# Patient Record
Sex: Male | Born: 1980 | Race: White | Hispanic: No | Marital: Married | Smoking: Never smoker
Health system: Southern US, Community
[De-identification: ages and names within clinical notes are randomized; demographics above are authoritative.]

## PROBLEM LIST (undated history)

## (undated) HISTORY — PX: FRACTURE SURGERY: SHX138

## (undated) HISTORY — PX: FACIAL RECONSTRUCTION SURGERY: SHX631

---
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
## 2001-07-03 ENCOUNTER — Encounter: Payer: Self-pay | Admitting: Emergency Medicine

## 2001-07-03 ENCOUNTER — Ambulatory Visit (HOSPITAL_COMMUNITY): Admission: EM | Admit: 2001-07-03 | Discharge: 2001-07-03 | Payer: Self-pay | Admitting: Emergency Medicine

## 2005-01-13 ENCOUNTER — Ambulatory Visit (HOSPITAL_COMMUNITY): Admission: RE | Admit: 2005-01-13 | Discharge: 2005-01-13 | Payer: Self-pay | Admitting: Otolaryngology

## 2005-01-13 ENCOUNTER — Ambulatory Visit (HOSPITAL_BASED_OUTPATIENT_CLINIC_OR_DEPARTMENT_OTHER): Admission: RE | Admit: 2005-01-13 | Discharge: 2005-01-13 | Payer: Self-pay | Admitting: Otolaryngology

## 2007-06-16 HISTORY — PX: NASAL SINUS SURGERY: SHX719

## 2013-03-23 ENCOUNTER — Ambulatory Visit: Payer: Self-pay | Admitting: Emergency Medicine

## 2013-03-23 VITALS — BP 136/96 | HR 93 | Temp 98.2°F | Resp 18 | Ht 66.5 in | Wt 222.4 lb

## 2013-03-23 DIAGNOSIS — J018 Other acute sinusitis: Secondary | ICD-10-CM

## 2013-03-23 MED ORDER — PSEUDOEPHEDRINE-GUAIFENESIN ER 60-600 MG PO TB12: 1.0000 | ORAL_TABLET | Freq: Two times a day (BID) | ORAL | Status: AC

## 2013-03-23 MED ORDER — AMOXICILLIN-POT CLAVULANATE 875-125 MG PO TABS: 1.0000 | ORAL_TABLET | Freq: Two times a day (BID) | ORAL | Status: DC

## 2013-03-23 NOTE — Progress Notes (Signed)
[facility] [address], [place] [address] [phone]  Date:  03/23/2013   Name:  [name]   DOB:  1980/06/24   MRN:  [id number]  PCP:  No primary provider on file.    Chief Complaint: Cough, Generalized Body Aches and Nasal Congestion   History of Present Illness:  Brandon Wiggins is a 32 y.o. very pleasant male patient who presents with the following:  Ill two days with nasal congestion and drainage that is purulent in nature. Has cough and sore throat.  Non productive cough.  No fever or chills.  No nausea or vomiting.  No wheezing or shortness of breath.  No rash.  Moderate post nasal drainage.  No improvement with over the counter medications or other home remedies. Denies other complaint or health concern today.   There are no active problems to display for this patient.   History reviewed. No pertinent past medical history.  Past Surgical History  Procedure Laterality Date  . Fracture surgery    . Nasal sinus surgery  2009    History  Substance Use Topics  . Smoking status: Never Smoker   . Smokeless tobacco: Not on file  . Alcohol Use: No    No family history on file.  No Known Allergies  Medication list has been reviewed and updated.  No current outpatient prescriptions on file prior to visit.   No current facility-administered medications on file prior to visit.    Review of Systems:  As per HPI, otherwise negative.    Physical Examination: Filed Vitals:   03/23/13 1723  BP: 136/96  Pulse: 93  Temp: 98.2 F (36.8 C)  Resp: 18   Filed Vitals:   03/23/13 1723  Height: 5' 6.5" (1.689 m)  Weight: 222 lb 6.4 oz (100.88 kg)   Body mass index is 35.36 kg/(m^2). Ideal Body Weight: Weight in (lb) to have BMI = 25: 156.9  GEN: WDWN, NAD, Non-toxic, A & O x 3 HEENT: Atraumatic, Normocephalic. Neck supple. No masses, No LAD. Ears and Nose: No external deformity.  Green nasal drainage CV: RRR, No M/G/R. No JVD.  No thrill. No extra heart sounds. PULM: CTA B, no wheezes, crackles, rhonchi. No retractions. No resp. distress. No accessory muscle use. ABD: S, NT, ND, +BS. No rebound. No HSM. EXTR: No c/c/e NEURO Normal gait.  PSYCH: Normally interactive. Conversant. Not depressed or anxious appearing.  Calm demeanor.    Assessment and Plan: Sinusitis augmentin mucinex   Signed,  Phillips Odor, MD

## 2013-03-23 NOTE — Patient Instructions (Signed)

## 2014-03-12 ENCOUNTER — Ambulatory Visit: Payer: No Typology Code available for payment source

## 2014-03-12 ENCOUNTER — Encounter: Payer: Self-pay | Admitting: Internal Medicine

## 2014-03-12 ENCOUNTER — Ambulatory Visit: Payer: No Typology Code available for payment source | Admitting: Physician Assistant

## 2014-03-28 ENCOUNTER — Ambulatory Visit: Payer: Self-pay | Admitting: Physician Assistant

## 2014-09-27 ENCOUNTER — Emergency Department (HOSPITAL_BASED_OUTPATIENT_CLINIC_OR_DEPARTMENT_OTHER)
Admission: EM | Admit: 2014-09-27 | Discharge: 2014-09-27 | Disposition: A | Discharge status: Home / Self Care | Payer: No Typology Code available for payment source | Attending: Emergency Medicine | Admitting: Emergency Medicine

## 2014-09-27 ENCOUNTER — Emergency Department (HOSPITAL_BASED_OUTPATIENT_CLINIC_OR_DEPARTMENT_OTHER): Payer: No Typology Code available for payment source

## 2014-09-27 ENCOUNTER — Encounter (HOSPITAL_BASED_OUTPATIENT_CLINIC_OR_DEPARTMENT_OTHER): Payer: Self-pay | Admitting: *Deleted

## 2014-09-27 DIAGNOSIS — R0602 Shortness of breath: Secondary | ICD-10-CM | POA: Insufficient documentation

## 2014-09-27 DIAGNOSIS — Z792 Long term (current) use of antibiotics: Secondary | ICD-10-CM | POA: Insufficient documentation

## 2014-09-27 DIAGNOSIS — R079 Chest pain, unspecified: Secondary | ICD-10-CM

## 2014-09-27 DIAGNOSIS — R0789 Other chest pain: Secondary | ICD-10-CM | POA: Insufficient documentation

## 2014-09-27 DIAGNOSIS — Z8781 Personal history of (healed) traumatic fracture: Secondary | ICD-10-CM | POA: Diagnosis not present

## 2014-09-27 LAB — CBC WITH DIFFERENTIAL/PLATELET
Basophils Absolute: 0 10*3/uL (ref 0.0–0.1)
Basophils Relative: 0 % (ref 0–1)
EOS ABS: 0.3 10*3/uL (ref 0.0–0.7)
EOS PCT: 4 % (ref 0–5)
HCT: 42 % (ref 39.0–52.0)
Hemoglobin: 14.5 g/dL (ref 13.0–17.0)
LYMPHS ABS: 2.2 10*3/uL (ref 0.7–4.0)
Lymphocytes Relative: 25 % (ref 12–46)
MCH: 29.1 pg (ref 26.0–34.0)
MCHC: 34.5 g/dL (ref 30.0–36.0)
MCV: 84.2 fL (ref 78.0–100.0)
Monocytes Absolute: 0.9 10*3/uL (ref 0.1–1.0)
Monocytes Relative: 10 % (ref 3–12)
Neutro Abs: 5.2 10*3/uL (ref 1.7–7.7)
Neutrophils Relative %: 61 % (ref 43–77)
Platelets: 273 10*3/uL (ref 150–400)
RBC: 4.99 MIL/uL (ref 4.22–5.81)
RDW: 12.7 % (ref 11.5–15.5)
WBC: 8.5 10*3/uL (ref 4.0–10.5)

## 2014-09-27 LAB — BASIC METABOLIC PANEL
Anion gap: 11 (ref 5–15)
BUN: 17 mg/dL (ref 6–23)
CHLORIDE: 102 mmol/L (ref 96–112)
CO2: 26 mmol/L (ref 19–32)
CREATININE: 1.34 mg/dL (ref 0.50–1.35)
Calcium: 9.2 mg/dL (ref 8.4–10.5)
GFR calc non Af Amer: 68 mL/min — ABNORMAL LOW (ref >90)
GFR, EST AFRICAN AMERICAN: 79 mL/min — AB (ref >90)
Glucose, Bld: 110 mg/dL — ABNORMAL HIGH (ref 70–99)
POTASSIUM: 3.8 mmol/L (ref 3.5–5.1)
SODIUM: 139 mmol/L (ref 135–145)

## 2014-09-27 LAB — TROPONIN I

## 2014-09-27 NOTE — Discharge Instructions (Signed)

## 2014-09-27 NOTE — ED Notes (Signed)
MD at bedside. 

## 2014-09-27 NOTE — ED Provider Notes (Signed)
CSN: [id number]     Arrival date & time 09/27/14  1921 History   This chart was scribed for Brandon LovelessScott Celestine Prim, MD by Modena JanskyAlbert Thayil, ED Scribe. This patient was seen in room MH04/MH04 and the patient's care was started at 7:59 PM.   Chief Complaint  Patient presents with  . Chest Pain   The history is provided by the patient. No language interpreter was used.   HPI Comments: Brandon Wiggins is a 34 y.o. male who presents to the Emergency Department complaining of constant chest pain just left of sternum that has been going on for "months" (unable to clarify exact onset). He reports that he went to the Urgent Care recently to be seen, then came here to the ED because he was waiting too long. He states that he is concerned about MI. He reports that his pain has been waxing and waning with no modifying factors. He describes the pain as a tightness sensation with intermittent SOB. He reports no treatment PTA. He denies any abdominal pain or leg swelling. No exertional symptoms. No pleuritic symptoms.  No past medical history on file. Past Surgical History  Procedure Laterality Date  . Fracture surgery    . Nasal sinus surgery  2009  . Facial reconstruction surgery     No family history on file. History  Substance Use Topics  . Smoking status: Never Smoker   . Smokeless tobacco: Never Used  . Alcohol Use: No    Review of Systems  Constitutional: Negative for fever.  Respiratory: Positive for shortness of breath. Negative for cough.   Cardiovascular: Positive for chest pain. Negative for leg swelling.  Gastrointestinal: Negative for vomiting and abdominal pain.  All other systems reviewed and are negative.   Allergies  Review of patient's allergies indicates no known allergies.  Home Medications   Prior to Admission medications   Medication Sig Start Date End Date Taking? Authorizing Provider  amoxicillin-clavulanate (AUGMENTIN) 875-125 MG per tablet Take 1 tablet by mouth 2 (two)  times daily. 03/23/13   Carmelina DaneJeffery S Anderson, MD   BP 148/108 mmHg  Pulse 64  Temp(Src) 98.6 F (37 C) (Oral)  Resp 20  Ht 5\' 6"  (1.676 m)  Wt 225 lb (102.059 kg)  BMI 36.33 kg/m2  SpO2 100% Physical Exam  Constitutional: He is oriented to person, place, and time. He appears well-developed and well-nourished. No distress.  HENT:  Head: Normocephalic and atraumatic.  Right Ear: External ear normal.  Left Ear: External ear normal.  Nose: Nose normal.  Eyes: Right eye exhibits no discharge. Left eye exhibits no discharge.  Neck: Neck supple.  Cardiovascular: Normal rate, regular rhythm and normal heart sounds.   No murmur heard. Pulmonary/Chest: Effort normal and breath sounds normal. No respiratory distress. He exhibits no tenderness.  Abdominal: Soft. He exhibits no distension. There is no tenderness.  Musculoskeletal: He exhibits no edema.  Neurological: He is alert and oriented to person, place, and time.  Skin: Skin is warm and dry.  Psychiatric: He has a normal mood and affect.  Nursing note and vitals reviewed.   ED Course  Procedures (including critical care time) DIAGNOSTIC STUDIES: Oxygen Saturation is 100% on RA, normal by my interpretation.    COORDINATION OF CARE: 8:03 PM- Pt advised of plan for treatment which includes radiology and labs and pt agrees.  Labs Review Labs Reviewed  BASIC METABOLIC PANEL - Abnormal; Notable for the following:    Glucose, Bld 110 (*)    GFR  calc non Af Amer 68 (*)    GFR calc Af Amer 79 (*)    All other components within normal limits  CBC WITH DIFFERENTIAL/PLATELET  TROPONIN I    Imaging Review Dg Chest 2 View  09/27/2014   CLINICAL DATA:  Chest pain for 2 months.  EXAM: CHEST  2 VIEW  COMPARISON:  None.  FINDINGS: The heart size and mediastinal contours are within normal limits. Both lungs are clear. No pneumothorax or pleural effusion is noted. The visualized skeletal structures are unremarkable.  IMPRESSION: No active  cardiopulmonary disease.   Electronically Signed   By: Lupita Raider, M.D.   On: 09/27/2014 21:04     EKG Interpretation   Date/Time:  Thursday September 27 2014 19:28:49 EDT Ventricular Rate:  66 PR Interval:  136 QRS Duration: 92 QT Interval:  368 QTC Calculation: 385 R Axis:   41 Text Interpretation:  Normal sinus rhythm Normal ECG No old tracing to  compare Confirmed by Brandon Scarbrough  MD, Cierah Crader ([id number]) on 09/27/2014 7:48:07 PM      MDM   Final diagnoses:  Chest pain, unspecified chest pain type    No obvious etiology for patient's now chronic chest pain. ECG unremarkable, as well as a troponin. Do not feel he needs a delta given chronicity. CXR unremarkable. Physical exam benign. Low risk for PE and is PERC negative. Will recommend using ibuprofen (states he doesn't take medicine because he can "take the pain") and PCP f/u.  I personally performed the services described in this documentation, which was scribed in my presence. The recorded information has been reviewed and is accurate.     Brandon Loveless, MD 09/28/14 [phone]

## 2014-09-27 NOTE — ED Notes (Signed)
Pt reports chest pain "for months"- unable to see PCP- states went to Urgent Care once to be eval but wait was too long- states pain is always there, sometimes more severe than others

## 2018-09-15 ENCOUNTER — Observation Stay (HOSPITAL_COMMUNITY)
Admission: EM | Admit: 2018-09-15 | Discharge: 2018-09-16 | Disposition: A | Discharge status: Home / Self Care | Payer: Self-pay | Attending: Surgery | Admitting: Surgery

## 2018-09-15 ENCOUNTER — Emergency Department (HOSPITAL_COMMUNITY): Payer: Self-pay

## 2018-09-15 ENCOUNTER — Other Ambulatory Visit: Payer: Self-pay

## 2018-09-15 ENCOUNTER — Ambulatory Visit
Admission: EM | Admit: 2018-09-15 | Discharge: 2018-09-15 | Disposition: A | Discharge status: Home / Self Care | Payer: No Typology Code available for payment source

## 2018-09-15 DIAGNOSIS — K3580 Unspecified acute appendicitis: Principal | ICD-10-CM | POA: Diagnosis present

## 2018-09-15 DIAGNOSIS — R1031 Right lower quadrant pain: Secondary | ICD-10-CM

## 2018-09-15 DIAGNOSIS — K429 Umbilical hernia without obstruction or gangrene: Secondary | ICD-10-CM | POA: Insufficient documentation

## 2018-09-15 DIAGNOSIS — K3589 Other acute appendicitis without perforation or gangrene: Secondary | ICD-10-CM

## 2018-09-15 LAB — COMPREHENSIVE METABOLIC PANEL
ALT: 33 U/L (ref 0–44)
AST: 25 U/L (ref 15–41)
Albumin: 4.2 g/dL (ref 3.5–5.0)
Alkaline Phosphatase: 54 U/L (ref 38–126)
Anion gap: 10 (ref 5–15)
BUN: 16 mg/dL (ref 6–20)
CO2: 25 mmol/L (ref 22–32)
Calcium: 9.2 mg/dL (ref 8.9–10.3)
Chloride: 102 mmol/L (ref 98–111)
Creatinine, Ser: 1.17 mg/dL (ref 0.61–1.24)
GFR calc Af Amer: >60 mL/min (ref >60)
GFR calc non Af Amer: >60 mL/min (ref >60)
Glucose, Bld: 104 mg/dL — ABNORMAL HIGH (ref 70–99)
Potassium: 3.7 mmol/L (ref 3.5–5.1)
Sodium: 137 mmol/L (ref 135–145)
Total Bilirubin: 0.8 mg/dL (ref 0.3–1.2)
Total Protein: 7.9 g/dL (ref 6.5–8.1)

## 2018-09-15 LAB — CBC WITH DIFFERENTIAL/PLATELET
Abs Immature Granulocytes: 0.04 10*3/uL (ref 0.00–0.07)
Basophils Absolute: 0.1 10*3/uL (ref 0.0–0.1)
Basophils Relative: 1 %
Eosinophils Absolute: 0.2 10*3/uL (ref 0.0–0.5)
Eosinophils Relative: 2 %
HCT: 41.4 % (ref 39.0–52.0)
Hemoglobin: 14.1 g/dL (ref 13.0–17.0)
Immature Granulocytes: 0 %
Lymphocytes Relative: 16 %
Lymphs Abs: 1.7 10*3/uL (ref 0.7–4.0)
MCH: 29.1 pg (ref 26.0–34.0)
MCHC: 34.1 g/dL (ref 30.0–36.0)
MCV: 85.5 fL (ref 80.0–100.0)
Monocytes Absolute: 1 10*3/uL (ref 0.1–1.0)
Monocytes Relative: 9 %
Neutro Abs: 7.4 10*3/uL (ref 1.7–7.7)
Neutrophils Relative %: 72 %
Platelets: 269 10*3/uL (ref 150–400)
RBC: 4.84 MIL/uL (ref 4.22–5.81)
RDW: 12.5 % (ref 11.5–15.5)
WBC: 10.4 10*3/uL (ref 4.0–10.5)
nRBC: 0 % (ref 0.0–0.2)

## 2018-09-15 LAB — URINALYSIS, ROUTINE W REFLEX MICROSCOPIC
Bilirubin Urine: NEGATIVE
Glucose, UA: NEGATIVE mg/dL
Hgb urine dipstick: NEGATIVE
Ketones, ur: NEGATIVE mg/dL
Leukocytes,Ua: NEGATIVE
Nitrite: NEGATIVE
Protein, ur: NEGATIVE mg/dL
Specific Gravity, Urine: 1.025 (ref 1.005–1.030)
pH: 5 (ref 5.0–8.0)

## 2018-09-15 LAB — LIPASE, BLOOD: Lipase: 25 U/L (ref 11–51)

## 2018-09-15 MED ORDER — ONDANSETRON 4 MG PO TBDP
4.0000 mg | ORAL_TABLET | Freq: Four times a day (QID) | ORAL | Status: DC | PRN
  Administered 2018-09-16: 4 mg via ORAL

## 2018-09-15 MED ORDER — DIPHENHYDRAMINE HCL 25 MG PO CAPS: 25.0000 mg | ORAL_CAPSULE | Freq: Four times a day (QID) | ORAL | Status: DC | PRN

## 2018-09-15 MED ORDER — IOHEXOL 300 MG/ML  SOLN
100.0000 mL | Freq: Once | INTRAMUSCULAR | Status: AC | PRN
  Administered 2018-09-15: 22:00:00 100 mL via INTRAVENOUS

## 2018-09-15 MED ORDER — MORPHINE SULFATE (PF) 2 MG/ML IV SOLN
2.0000 mg | INTRAVENOUS | Status: DC | PRN
  Administered 2018-09-16: 2 mg via INTRAVENOUS
  Filled 2018-09-15: qty 1

## 2018-09-15 MED ORDER — SODIUM CHLORIDE 0.9 % IV SOLN
2.0000 g | INTRAVENOUS | Status: DC
  Administered 2018-09-16: 2 g via INTRAVENOUS
  Filled 2018-09-15 (×2): qty 20

## 2018-09-15 MED ORDER — POTASSIUM CHLORIDE IN NACL 20-0.9 MEQ/L-% IV SOLN
INTRAVENOUS | Status: DC
  Administered 2018-09-16: via INTRAVENOUS
  Filled 2018-09-15: qty 1000

## 2018-09-15 MED ORDER — ONDANSETRON HCL 4 MG/2ML IJ SOLN
4.0000 mg | Freq: Four times a day (QID) | INTRAMUSCULAR | Status: DC | PRN
  Administered 2018-09-16: 4 mg via INTRAVENOUS
  Filled 2018-09-15 (×2): qty 2

## 2018-09-15 MED ORDER — METRONIDAZOLE IN NACL 5-0.79 MG/ML-% IV SOLN
500.0000 mg | Freq: Three times a day (TID) | INTRAVENOUS | Status: DC
  Administered 2018-09-16 (×2): 500 mg via INTRAVENOUS
  Filled 2018-09-15 (×2): qty 100

## 2018-09-15 MED ORDER — DIPHENHYDRAMINE HCL 50 MG/ML IJ SOLN: 25.0000 mg | Freq: Four times a day (QID) | INTRAMUSCULAR | Status: DC | PRN

## 2018-09-15 NOTE — ED Provider Notes (Signed)
2208: Pt handed off to me by previous EDPA at shift change pending CTAP to r/o appendicitis. Last food at 1230, drank water en route to ED.  Physical Exam  BP (!) 162/113 (BP Location: Left Arm)   Pulse (!) 102   Temp 98.6 F (37 C) (Oral)   Resp 18   SpO2 97%    ED Course/Procedures   Clinical Course as of Sep 15 2206  Thu Sep 15, 2018  2133 Called CT regarding delay apparently not aware if pt had IV. Will come get patient.    [CG]    Clinical Course User Index [CG] Liberty Handy, PA-C    Procedures  MDM   2209: CTAP confirms appendicitis w/o perforation or abscess. Labs unremarkable. VS WNL and stable.  No SIRS criteria. Pt not requiring analgesia or antiemetics. Spoke to Dr Gwinda Passe who will see patient.       Liberty Handy, PA-C 09/15/18 2210    Charlynne Pander, MD 09/15/18 [phone]

## 2018-09-15 NOTE — ED Notes (Signed)
Report given to 6N

## 2018-09-15 NOTE — H&P (Signed)
Brandon Wiggins is an 38 y.o. male.   Chief Complaint: RLQ abdominal pain HPI: This is a 37 year old male who presents with a 3 day history of slowly worsening abdominal pain, now localized to the RLQ.  No nausea, vomiting, or diarrhea.  He reports some subjective fever.  Decreased appetite.  Difficulty in BM for the last two days.  No previous abdominal surgery  No past medical history on file.  Past Surgical History:  Procedure Laterality Date  . FACIAL RECONSTRUCTION SURGERY    . FRACTURE SURGERY    . NASAL SINUS SURGERY  2009    No family history on file. Social History:  reports that he has never smoked. He has never used smokeless tobacco. He reports that he does not drink alcohol or use drugs.  Allergies: No Known Allergies  No meds   Results for orders placed or performed during the hospital encounter of 09/15/18 (from the past 48 hour(s))  Comprehensive metabolic panel     Status: Abnormal   Collection Time: 09/15/18  6:57 PM  Result Value Ref Range   Sodium 137 135 - 145 mmol/L   Potassium 3.7 3.5 - 5.1 mmol/L   Chloride 102 98 - 111 mmol/L   CO2 25 22 - 32 mmol/L   Glucose, Bld 104 (H) 70 - 99 mg/dL   BUN 16 6 - 20 mg/dL   Creatinine, Ser 8.08 0.61 - 1.24 mg/dL   Calcium 9.2 8.9 - 81.1 mg/dL   Total Protein 7.9 6.5 - 8.1 g/dL   Albumin 4.2 3.5 - 5.0 g/dL   AST 25 15 - 41 U/L   ALT 33 0 - 44 U/L   Alkaline Phosphatase 54 38 - 126 U/L   Total Bilirubin 0.8 0.3 - 1.2 mg/dL   GFR calc non Af Amer >60 >60 mL/min   GFR calc Af Amer >60 >60 mL/min   Anion gap 10 5 - 15    Comment: Performed at [facility], [address], [place], [place] [address]  CBC with Differential     Status: None   Collection Time: 09/15/18  6:57 PM  Result Value Ref Range   WBC 10.4 4.0 - 10.5 K/uL   RBC 4.84 4.22 - 5.81 MIL/uL   Hemoglobin 14.1 13.0 - 17.0 g/dL   HCT 45.8 59.2 - 92.4 %   MCV 85.5 80.0 - 100.0 fL   MCH 29.1 26.0 - 34.0 pg   MCHC 34.1 30.0 - 36.0 g/dL   RDW  46.2 86.3 - 81.7 %   Platelets 269 150 - 400 K/uL   nRBC 0.0 0.0 - 0.2 %   Neutrophils Relative % 72 %   Neutro Abs 7.4 1.7 - 7.7 K/uL   Lymphocytes Relative 16 %   Lymphs Abs 1.7 0.7 - 4.0 K/uL   Monocytes Relative 9 %   Monocytes Absolute 1.0 0.1 - 1.0 K/uL   Eosinophils Relative 2 %   Eosinophils Absolute 0.2 0.0 - 0.5 K/uL   Basophils Relative 1 %   Basophils Absolute 0.1 0.0 - 0.1 K/uL   Immature Granulocytes 0 %   Abs Immature Granulocytes 0.04 0.00 - 0.07 K/uL    Comment: Performed at [facility], [address], [place], [place] [address]  Lipase, blood     Status: None   Collection Time: 09/15/18  6:57 PM  Result Value Ref Range   Lipase 25 11 - 51 U/L    Comment: Performed at [facility], [address] [place], [place] [address]  Urinalysis, Routine w reflex microscopic     Status: None   Collection Time: 09/15/18  7:50 PM  Result Value Ref Range   Color, Urine YELLOW YELLOW   APPearance CLEAR CLEAR   Specific Gravity, Urine 1.025 1.005 - 1.030   pH 5.0 5.0 - 8.0   Glucose, UA NEGATIVE NEGATIVE mg/dL   Hgb urine dipstick NEGATIVE NEGATIVE   Bilirubin Urine NEGATIVE NEGATIVE   Ketones, ur NEGATIVE NEGATIVE mg/dL   Protein, ur NEGATIVE NEGATIVE mg/dL   Nitrite NEGATIVE NEGATIVE   Leukocytes,Ua NEGATIVE NEGATIVE    Comment: Performed at [facility], [address] [place], [place] [address]   Ct Abdomen Pelvis W Contrast  Result Date: 09/15/2018 CLINICAL DATA:  Right lower quadrant abdominal pain. EXAM: CT ABDOMEN AND PELVIS WITH CONTRAST TECHNIQUE: Multidetector CT imaging of the abdomen and pelvis was performed using the standard protocol following bolus administration of intravenous contrast. CONTRAST:  OMNIPAQUE IOHEXOL 300 MG/ML  SOLN COMPARISON:  None. FINDINGS: Lower chest: No acute abnormality. Hepatobiliary: No focal liver abnormality is seen. No gallstones, gallbladder wall thickening, or biliary dilatation. Pancreas: Unremarkable.  No pancreatic ductal dilatation or surrounding inflammatory changes. Spleen: Normal in size without focal abnormality. Adrenals/Urinary Tract: Adrenal glands are unremarkable. Kidneys are normal, without renal calculi, focal lesion, or hydronephrosis. Bladder is unremarkable. Stomach/Bowel: The stomach appears normal. There is no evidence of bowel obstruction or inflammation. The appendix is enlarged with surrounding inflammation consistent with acute appendicitis. Appendix: Location: Right lower quadrant. Diameter: 16 mm. Appendicolith: No. Mucosal hyper-enhancement: Yes. Extraluminal gas: No. Periappendiceal collection: No. Vascular/Lymphatic: No significant vascular findings are present. No enlarged abdominal or pelvic lymph nodes. Reproductive: Prostate is unremarkable. Other: Moderate size fat containing periumbilical hernia is noted. No ascites is noted. Musculoskeletal: No acute or significant osseous findings. IMPRESSION: Findings consistent with acute appendicitis. No definite evidence of abscess or perforation is noted. Electronically Signed   By: Lupita Raider, M.D.   On: 09/15/2018 22:02    Review of Systems  Constitutional: Negative for weight loss.  HENT: Negative for ear discharge, ear pain, hearing loss and tinnitus.   Eyes: Negative for blurred vision, double vision, photophobia and pain.  Respiratory: Negative for cough, sputum production and shortness of breath.   Cardiovascular: Negative for chest pain.  Gastrointestinal: Positive for abdominal pain. Negative for nausea and vomiting.  Genitourinary: Negative for dysuria, flank pain, frequency and urgency.  Musculoskeletal: Negative for back pain, falls, joint pain, myalgias and neck pain.  Neurological: Negative for dizziness, tingling, sensory change, focal weakness, loss of consciousness and headaches.  Endo/Heme/Allergies: Does not bruise/bleed easily.  Psychiatric/Behavioral: Negative for depression, memory loss and substance  abuse. The patient is not nervous/anxious.     Blood pressure (!) 162/113, pulse (!) 102, temperature 98.6 F (37 C), temperature source Oral, resp. rate 18, SpO2 97 %. Physical Exam  WDWN in NAD Eyes:  Pupils equal, round; sclera anicteric HENT:  Oral mucosa moist; good dentition  Neck:  No masses palpated, no thyromegaly Lungs:  CTA bilaterally; normal respiratory effort CV:  Regular rate and rhythm; no murmurs; extremities well-perfused with no edema Abd:  +bowel sounds, soft, tender in RLQ; mild guarding; protruding reducible umbilical hernia Skin:  Warm, dry; no sign of jaundice Psychiatric - alert and oriented x 4; calm mood and affect  Assessment/Plan 1.  Acute appendicitis - no sign of perforation 2.  Umbilical hernia - reducible  Admit for IV antibiotics Laparoscopic appendectomy and probable primary repair of umbilical hernia tomorrow by Dr. Magnus Ivan.  The surgical procedure has been discussed with the patient.  Potential risks, benefits, alternative treatments, and expected outcomes have been explained.  All of the patient's questions at this time have been answered.  The likelihood of reaching the patient's treatment goal is good.  The patient understand the proposed surgical procedure and wishes to proceed.   Wynona Luna, MD 09/15/2018, 10:39 PM

## 2018-09-15 NOTE — ED Provider Notes (Addendum)
[facility]    CSN: [id number] Arrival date & time: 09/15/18  1631     History   Chief Complaint Chief Complaint  Patient presents with  . Abdominal Pain    HPI Brandon Wiggins is a 38 y.o. male.   38 year old comes in for 3-day history of right lower quadrant pain.  Patient states is mostly constant, but waxes and wanes in intensity.  When he palpates the area, has soreness to it, but movement causes shooting pain in the area.  He denies nausea, vomiting, though has decreased appetite.  He states maybe mild worsening of pain after eating, but not enough to prevent him from eating.  States has had fevers at nighttime, T-max 101.  He denies urinary symptoms such as frequency, dysuria, hematuria.  States has not moved his bowels for 2 days, took a laxative, and had bowel movement this morning without relief of symptoms.  He denies history of abdominal surgery.  Still passing flatus.     History reviewed. No pertinent past medical history.  There are no active problems to display for this patient.   Past Surgical History:  Procedure Laterality Date  . FACIAL RECONSTRUCTION SURGERY    . FRACTURE SURGERY    . NASAL SINUS SURGERY  2009       Home Medications    Prior to Admission medications   Not on File    Family History No family history on file.  Social History Social History   Tobacco Use  . Smoking status: Never Smoker  . Smokeless tobacco: Never Used  Substance Use Topics  . Alcohol use: No  . Drug use: No     Allergies   Patient has no known allergies.   Review of Systems Review of Systems  Reason unable to perform ROS: See HPI as above.     Physical Exam Triage Vital Signs ED Triage Vitals  Enc Vitals Group     BP 09/15/18 1638 (!) 149/97     Pulse Rate 09/15/18 1638 88     Resp --      Temp 09/15/18 1638 98.2 F (36.8 C)     Temp Source 09/15/18 1638 Oral     SpO2 09/15/18 1638 98 %     Weight --      Height --    Head Circumference --      Peak Flow --      Pain Score 09/15/18 1639 4     Pain Loc --      Pain Edu? --      Excl. in GC? --    No data found.  Updated Vital Signs BP (!) 149/97 (BP Location: Left Arm)   Pulse 88   Temp 98.2 F (36.8 C) (Oral)   SpO2 98%   Physical Exam Constitutional:      General: He is not in acute distress.    Appearance: He is well-developed. He is not ill-appearing, toxic-appearing or diaphoretic.  HENT:     Head: Normocephalic and atraumatic.  Cardiovascular:     Rate and Rhythm: Normal rate and regular rhythm.     Heart sounds: Normal heart sounds. No murmur. No friction rub. No gallop.   Pulmonary:     Effort: Pulmonary effort is normal.     Breath sounds: Normal breath sounds. No wheezing or rales.  Abdominal:     General: Bowel sounds are normal.     Palpations: Abdomen is soft.     Tenderness: There  is no right CVA tenderness or left CVA tenderness.     Comments: Patient with umbilical hernia, reducible, without tenderness to palpation.  Tenderness to palpation of right lower quadrant with guarding and rebound. Positive McBurney's.  No obvious Rovsing's, obturator, psoas sign.  Skin:    General: Skin is warm and dry.  Neurological:     Mental Status: He is alert and oriented to person, place, and time.  Psychiatric:        Behavior: Behavior normal.        Judgment: Judgment normal.      UC Treatments / Results  Labs (all labs ordered are listed, but only abnormal results are displayed) Labs Reviewed - No data to display  EKG None  Radiology No results found.  Procedures Procedures (including critical care time)  Medications Ordered in UC Medications - No data to display  Initial Impression / Assessment and Plan / UC Course  I have reviewed the triage vital signs and the nursing notes.  Pertinent labs & imaging results that were available during my care of the patient were reviewed by me and considered in my medical  decision making (see chart for details).    38 year old male comes in for 3-day history of right lower quadrant pain.  Denies nausea, vomiting.  Has had T-max of 101.  He is currently afebrile without tachycardia or tachypnea.  He has right lower quadrant pain with rebound and guarding.  He has had bowel movements without relief of his pain, still passing flatus.  He has an umbilical hernia that is completely reducible, nontender. Denies history of abdominal surgery.  Given history and exam, discussed worries for appendicitis, will discharge patient in stable condition to the emergency department for further evaluation and management needed.  Patient expresses understanding and agrees to plan.  Final Clinical Impressions(s) / UC Diagnoses   Final diagnoses:  Abdominal pain, RLQ    ED Prescriptions    None        [name] 09/15/18 1703    Linward Headland V, PA-C 09/15/18 2233

## 2018-09-15 NOTE — ED Notes (Signed)
Significant others phone number [phone]

## 2018-09-15 NOTE — ED Triage Notes (Signed)
Pt c/o RLQ pain x3 days, fever last night, difficulty having a BM

## 2018-09-15 NOTE — ED Triage Notes (Signed)
Pt states he was sent from UC with complaint of lower abdominal pain. Ambulatory. Skin warm and dry.

## 2018-09-15 NOTE — Discharge Instructions (Signed)
Given RLQ pain, rebound, guarding, fever in the past 2 days, please go to the ED for further evaluation.

## 2018-09-15 NOTE — ED Notes (Signed)
ED TO INPATIENT HANDOFF REPORT  ED Nurse Name and Phone #:  Alvino Chapel [phone]  S Name/Age/Gender Brandon Wiggins 38 y.o. male Room/Bed: 013C/013C  Code Status   Code Status: Not on file  Home/SNF/Other Home Patient oriented to: self, place, time and situation Is this baseline? Yes   Triage Complete: Triage complete  Chief Complaint ABD pain  Triage Note Pt states he was sent from UC with complaint of lower abdominal pain. Ambulatory. Skin warm and dry.    Allergies No Known Allergies  Level of Care/Admitting Diagnosis ED Disposition    ED Disposition Condition Comment   Admit  Hospital Area: [facility] [[id number]]  Level of Care: Med-Surg [16]  Diagnosis: Acute appendicitis [295621]  Admitting Physician: CCS, MD [[id number]]  Attending Physician: CCS, MD [[id number]]  Bed request comments: 6N  PT Class (Do Not Modify): Observation [104]  PT Acc Code (Do Not Modify): Observation [10022]       B Medical/Surgery History No past medical history on file. Past Surgical History:  Procedure Laterality Date  . FACIAL RECONSTRUCTION SURGERY    . FRACTURE SURGERY    . NASAL SINUS SURGERY  2009     A IV Location/Drains/Wounds Patient Lines/Drains/Airways Status   Active Line/Drains/Airways    Name:   Placement date:   Placement time:   Site:   Days:   Peripheral IV 09/15/18 Right Antecubital   09/15/18    2132    Antecubital   less than 1          Intake/Output Last 24 hours No intake or output data in the 24 hours ending 09/15/18 2256  Labs/Imaging Results for orders placed or performed during the hospital encounter of 09/15/18 (from the past 48 hour(s))  Comprehensive metabolic panel     Status: Abnormal   Collection Time: 09/15/18  6:57 PM  Result Value Ref Range   Sodium 137 135 - 145 mmol/L   Potassium 3.7 3.5 - 5.1 mmol/L   Chloride 102 98 - 111 mmol/L   CO2 25 22 - 32 mmol/L   Glucose, Bld 104 (H) 70 - 99 mg/dL   BUN 16 6 - 20 mg/dL    Creatinine, Ser 3.08 0.61 - 1.24 mg/dL   Calcium 9.2 8.9 - 65.7 mg/dL   Total Protein 7.9 6.5 - 8.1 g/dL   Albumin 4.2 3.5 - 5.0 g/dL   AST 25 15 - 41 U/L   ALT 33 0 - 44 U/L   Alkaline Phosphatase 54 38 - 126 U/L   Total Bilirubin 0.8 0.3 - 1.2 mg/dL   GFR calc non Af Amer >60 >60 mL/min   GFR calc Af Amer >60 >60 mL/min   Anion gap 10 5 - 15    Comment: Performed at [facility], [address], [place], [place] [address]  CBC with Differential     Status: None   Collection Time: 09/15/18  6:57 PM  Result Value Ref Range   WBC 10.4 4.0 - 10.5 K/uL   RBC 4.84 4.22 - 5.81 MIL/uL   Hemoglobin 14.1 13.0 - 17.0 g/dL   HCT 29.5 28.4 - 13.2 %   MCV 85.5 80.0 - 100.0 fL   MCH 29.1 26.0 - 34.0 pg   MCHC 34.1 30.0 - 36.0 g/dL   RDW 44.0 10.2 - 72.5 %   Platelets 269 150 - 400 K/uL   nRBC 0.0 0.0 - 0.2 %   Neutrophils Relative % 72 %   Neutro  Abs 7.4 1.7 - 7.7 K/uL   Lymphocytes Relative 16 %   Lymphs Abs 1.7 0.7 - 4.0 K/uL   Monocytes Relative 9 %   Monocytes Absolute 1.0 0.1 - 1.0 K/uL   Eosinophils Relative 2 %   Eosinophils Absolute 0.2 0.0 - 0.5 K/uL   Basophils Relative 1 %   Basophils Absolute 0.1 0.0 - 0.1 K/uL   Immature Granulocytes 0 %   Abs Immature Granulocytes 0.04 0.00 - 0.07 K/uL    Comment: Performed at [facility], [address] [place], [place] [address]  Lipase, blood     Status: None   Collection Time: 09/15/18  6:57 PM  Result Value Ref Range   Lipase 25 11 - 51 U/L    Comment: Performed at [facility], [address] [place], [place] [address]  Urinalysis, Routine w reflex microscopic     Status: None   Collection Time: 09/15/18  7:50 PM  Result Value Ref Range   Color, Urine YELLOW YELLOW   APPearance CLEAR CLEAR   Specific Gravity, Urine 1.025 1.005 - 1.030   pH 5.0 5.0 - 8.0   Glucose, UA NEGATIVE NEGATIVE mg/dL   Hgb urine dipstick NEGATIVE NEGATIVE   Bilirubin Urine NEGATIVE NEGATIVE   Ketones, ur NEGATIVE NEGATIVE mg/dL    Protein, ur NEGATIVE NEGATIVE mg/dL   Nitrite NEGATIVE NEGATIVE   Leukocytes,Ua NEGATIVE NEGATIVE    Comment: Performed at [facility], [address], [place], [place] [address]   Ct Abdomen Pelvis W Contrast  Result Date: 09/15/2018 CLINICAL DATA:  Right lower quadrant abdominal pain. EXAM: CT ABDOMEN AND PELVIS WITH CONTRAST TECHNIQUE: Multidetector CT imaging of the abdomen and pelvis was performed using the standard protocol following bolus administration of intravenous contrast. CONTRAST:  OMNIPAQUE IOHEXOL 300 MG/ML  SOLN COMPARISON:  None. FINDINGS: Lower chest: No acute abnormality. Hepatobiliary: No focal liver abnormality is seen. No gallstones, gallbladder wall thickening, or biliary dilatation. Pancreas: Unremarkable. No pancreatic ductal dilatation or surrounding inflammatory changes. Spleen: Normal in size without focal abnormality. Adrenals/Urinary Tract: Adrenal glands are unremarkable. Kidneys are normal, without renal calculi, focal lesion, or hydronephrosis. Bladder is unremarkable. Stomach/Bowel: The stomach appears normal. There is no evidence of bowel obstruction or inflammation. The appendix is enlarged with surrounding inflammation consistent with acute appendicitis. Appendix: Location: Right lower quadrant. Diameter: 16 mm. Appendicolith: No. Mucosal hyper-enhancement: Yes. Extraluminal gas: No. Periappendiceal collection: No. Vascular/Lymphatic: No significant vascular findings are present. No enlarged abdominal or pelvic lymph nodes. Reproductive: Prostate is unremarkable. Other: Moderate size fat containing periumbilical hernia is noted. No ascites is noted. Musculoskeletal: No acute or significant osseous findings. IMPRESSION: Findings consistent with acute appendicitis. No definite evidence of abscess or perforation is noted. Electronically Signed   By: Lupita Raider, M.D.   On: 09/15/2018 22:02    Pending Labs Unresulted Labs (From admission, onward)     Start     Ordered   Signed and Held  HIV antibody (Routine Testing)  Once,   R     Signed and Held          Vitals/Pain Today's Vitals   09/15/18 1721 09/15/18 1953 09/15/18 2255  BP: (!) 162/113  (!) 143/107  Pulse: (!) 102  81  Resp: 18  18  Temp: 98.6 F (37 C)  98.8 F (37.1 C)  TempSrc: Oral  Oral  SpO2: 97%  97%  PainSc:  5      Isolation Precautions  No active isolations  Medications Medications  iohexol (OMNIPAQUE) 300 MG/ML solution 100 mL (100 mLs Intravenous Contrast Given 09/15/18 2144)    Mobility walks     Focused Assessments appendicitis, pt arrived with abdominal pain   R Recommendations: See Admitting Provider Note  Report given to:   Additional Notes: pt is alert and oriented

## 2018-09-15 NOTE — ED Provider Notes (Signed)
[facility] EMERGENCY DEPARTMENT Provider Note   CSN: [id number] Arrival date & time: 09/15/18  1717    History   Chief Complaint Chief Complaint  Patient presents with  . Abdominal Pain    HPI Brandon Wiggins is a 38 y.o. male without significant past medical history, presenting to the emergency department from urgent care with complaint of right lower quadrant abdominal pain that began 3 days ago.  Patient states pain is sharp and aching, waxes and wanes.  It is worse if he is ambulatory.  He has associated decreased appetite and a fever at home.  He treated his symptoms last night with ibuprofen, with relief of the fever, however abdominal pain persists.  He states his last normal bowel movement was Monday, and has had decreased bowel movement since that time.  He states he took a laxative yesterday thinking he may have been constipated, however that just caused diarrhea.  Denies associated urinary symptoms, testicular pain or swelling, pain with defecation, diarrhea, nausea, vomiting.  No history of abdominal surgery.  Patient evaluated at urgent care, and sent here for rule out appendicitis.     The history is provided by the patient and medical records.    No past medical history on file.  There are no active problems to display for this patient.   Past Surgical History:  Procedure Laterality Date  . FACIAL RECONSTRUCTION SURGERY    . FRACTURE SURGERY    . NASAL SINUS SURGERY  2009        Home Medications    Prior to Admission medications   Not on File    Family History No family history on file.  Social History Social History   Tobacco Use  . Smoking status: Never Smoker  . Smokeless tobacco: Never Used  Substance Use Topics  . Alcohol use: No  . Drug use: No     Allergies   Patient has no known allergies.   Review of Systems Review of Systems  All other systems reviewed and are negative.    Physical Exam Updated Vital  Signs BP (!) 162/113 (BP Location: Left Arm)   Pulse (!) 102   Temp 98.6 F (37 C) (Oral)   Resp 18   SpO2 97%   Physical Exam Vitals signs and nursing note reviewed.  Constitutional:      General: He is not in acute distress.    Appearance: He is well-developed. He is not ill-appearing.  HENT:     Head: Normocephalic and atraumatic.  Eyes:     Conjunctiva/sclera: Conjunctivae normal.  Cardiovascular:     Rate and Rhythm: Normal rate and regular rhythm.  Pulmonary:     Effort: Pulmonary effort is normal. No respiratory distress.     Breath sounds: Normal breath sounds.  Abdominal:     General: Bowel sounds are normal. There is no distension.     Palpations: Abdomen is soft.     Tenderness: There is abdominal tenderness in the right lower quadrant. There is rebound. There is no guarding. Positive signs include Rovsing's sign and McBurney's sign.     Hernia: A hernia is present. Hernia is present in the umbilical area (Nontender and reducible).  Skin:    General: Skin is warm.  Neurological:     Mental Status: He is alert.  Psychiatric:        Behavior: Behavior normal.      ED Treatments / Results  Labs (all labs ordered are listed, but  only abnormal results are displayed) Labs Reviewed  COMPREHENSIVE METABOLIC PANEL - Abnormal; Notable for the following components:      Result Value   Glucose, Bld 104 (*)    All other components within normal limits  CBC WITH DIFFERENTIAL/PLATELET  LIPASE, BLOOD  URINALYSIS, ROUTINE W REFLEX MICROSCOPIC    EKG None  Radiology No results found.  Procedures Procedures (including critical care time)  Medications Ordered in ED Medications - No data to display   Initial Impression / Assessment and Plan / ED Course  I have reviewed the triage vital signs and the nursing notes.  Pertinent labs & imaging results that were available during my care of the patient were reviewed by me and considered in my medical decision making  (see chart for details).       Patient presenting with gradual onset of RLQ abdominal pain that began 3 days ago. Associated symptoms include decreased appetite. Patient is nontoxic, nonseptic appearing, in no apparent distress. Abdomen is soft and tender to the RLQ with positive rovsing and rebound tenderness. VS are stable, afebrile. Pain is mild in the ED. Labs obtained. CT abd ordered with concern for acute appendicitis.   Care assumed at shift change by PA Margarette Asal, pending CT. Pt still complaining of minimal pain at this time, not requesting pain medication, but will alert nursing if pain worsens or if he develops nausea. Last meal at 1230, pt also drank water on the way to the ED.  Final Clinical Impressions(s) / ED Diagnoses   Final diagnoses:  None    ED Discharge Orders    None       Trevan Messman, [place] N, PA-C 09/15/18 2042    Charlynne Pander, MD 09/21/18 [phone]

## 2018-09-16 ENCOUNTER — Observation Stay (HOSPITAL_COMMUNITY): Payer: Self-pay | Admitting: Certified Registered Nurse Anesthetist

## 2018-09-16 ENCOUNTER — Encounter (HOSPITAL_COMMUNITY): Payer: Self-pay | Admitting: Certified Registered Nurse Anesthetist

## 2018-09-16 ENCOUNTER — Encounter (HOSPITAL_COMMUNITY)
Admission: EM | Disposition: A | Discharge status: Home / Self Care | Payer: Self-pay | Source: Home / Self Care | Attending: Emergency Medicine

## 2018-09-16 HISTORY — PX: LAPAROSCOPIC APPENDECTOMY: SHX408

## 2018-09-16 HISTORY — PX: UMBILICAL HERNIA REPAIR: SHX196

## 2018-09-16 LAB — SURGICAL PCR SCREEN
MRSA, PCR: NEGATIVE
Staphylococcus aureus: NEGATIVE

## 2018-09-16 LAB — HIV ANTIBODY (ROUTINE TESTING W REFLEX): HIV Screen 4th Generation wRfx: NONREACTIVE

## 2018-09-16 SURGERY — APPENDECTOMY, LAPAROSCOPIC
Anesthesia: General | Site: Abdomen

## 2018-09-16 MED ORDER — 0.9 % SODIUM CHLORIDE (POUR BTL) OPTIME
TOPICAL | Status: DC | PRN
  Administered 2018-09-16: 1000 mL

## 2018-09-16 MED ORDER — OXYCODONE HCL 5 MG PO TABS: 5.0000 mg | ORAL_TABLET | Freq: Four times a day (QID) | ORAL | 0 refills | Status: DC | PRN

## 2018-09-16 MED ORDER — PROPOFOL 10 MG/ML IV BOLUS
INTRAVENOUS | Status: DC | PRN
  Administered 2018-09-16: 200 mg via INTRAVENOUS

## 2018-09-16 MED ORDER — PROPOFOL 10 MG/ML IV BOLUS
INTRAVENOUS | Status: AC
  Filled 2018-09-16: qty 20

## 2018-09-16 MED ORDER — PHENYLEPHRINE 40 MCG/ML (10ML) SYRINGE FOR IV PUSH (FOR BLOOD PRESSURE SUPPORT)
PREFILLED_SYRINGE | INTRAVENOUS | Status: AC
  Filled 2018-09-16: qty 10

## 2018-09-16 MED ORDER — OXYCODONE HCL 5 MG PO TABS
5.0000 mg | ORAL_TABLET | ORAL | Status: DC | PRN
  Administered 2018-09-16: 10 mg via ORAL
  Filled 2018-09-16: qty 2
  Filled 2018-09-16: qty 1

## 2018-09-16 MED ORDER — FENTANYL CITRATE (PF) 250 MCG/5ML IJ SOLN
INTRAMUSCULAR | Status: AC
  Filled 2018-09-16: qty 5

## 2018-09-16 MED ORDER — ROCURONIUM BROMIDE 100 MG/10ML IV SOLN
INTRAVENOUS | Status: DC | PRN
  Administered 2018-09-16: 50 mg via INTRAVENOUS

## 2018-09-16 MED ORDER — EPHEDRINE 5 MG/ML INJ
INTRAVENOUS | Status: AC
  Filled 2018-09-16: qty 10

## 2018-09-16 MED ORDER — MIDAZOLAM HCL 5 MG/5ML IJ SOLN
INTRAMUSCULAR | Status: DC | PRN
  Administered 2018-09-16: 2 mg via INTRAVENOUS

## 2018-09-16 MED ORDER — DEXAMETHASONE SODIUM PHOSPHATE 10 MG/ML IJ SOLN
INTRAMUSCULAR | Status: DC | PRN
  Administered 2018-09-16: 5 mg via INTRAVENOUS

## 2018-09-16 MED ORDER — SUCCINYLCHOLINE CHLORIDE 20 MG/ML IJ SOLN
INTRAMUSCULAR | Status: DC | PRN
  Administered 2018-09-16: 100 mg via INTRAVENOUS

## 2018-09-16 MED ORDER — ONDANSETRON HCL 4 MG/2ML IJ SOLN
INTRAMUSCULAR | Status: DC | PRN
  Administered 2018-09-16: 4 mg via INTRAVENOUS

## 2018-09-16 MED ORDER — MIDAZOLAM HCL 2 MG/2ML IJ SOLN
INTRAMUSCULAR | Status: AC
  Filled 2018-09-16: qty 2

## 2018-09-16 MED ORDER — ONDANSETRON HCL 4 MG/2ML IJ SOLN
INTRAMUSCULAR | Status: AC
  Filled 2018-09-16: qty 2

## 2018-09-16 MED ORDER — SUGAMMADEX SODIUM 200 MG/2ML IV SOLN
INTRAVENOUS | Status: DC | PRN
  Administered 2018-09-16: 250 mg via INTRAVENOUS

## 2018-09-16 MED ORDER — BUPIVACAINE-EPINEPHRINE (PF) 0.25% -1:200000 IJ SOLN
INTRAMUSCULAR | Status: AC
  Filled 2018-09-16: qty 30

## 2018-09-16 MED ORDER — SODIUM CHLORIDE 0.9 % IV BOLUS: 500.0000 mL | Freq: Once | INTRAVENOUS | Status: DC

## 2018-09-16 MED ORDER — AMOXICILLIN-POT CLAVULANATE 875-125 MG PO TABS: 1.0000 | ORAL_TABLET | Freq: Two times a day (BID) | ORAL | 0 refills | Status: AC

## 2018-09-16 MED ORDER — BUPIVACAINE-EPINEPHRINE 0.25% -1:200000 IJ SOLN
INTRAMUSCULAR | Status: DC | PRN
  Administered 2018-09-16: 20 mL

## 2018-09-16 MED ORDER — DEXAMETHASONE SODIUM PHOSPHATE 10 MG/ML IJ SOLN
INTRAMUSCULAR | Status: AC
  Filled 2018-09-16: qty 2

## 2018-09-16 MED ORDER — SUCCINYLCHOLINE CHLORIDE 200 MG/10ML IV SOSY
PREFILLED_SYRINGE | INTRAVENOUS | Status: AC
  Filled 2018-09-16: qty 10

## 2018-09-16 MED ORDER — LIDOCAINE HCL (CARDIAC) PF 100 MG/5ML IV SOSY
PREFILLED_SYRINGE | INTRAVENOUS | Status: DC | PRN
  Administered 2018-09-16: 60 mg via INTRAVENOUS

## 2018-09-16 MED ORDER — LIDOCAINE 2% (20 MG/ML) 5 ML SYRINGE
INTRAMUSCULAR | Status: AC
  Filled 2018-09-16: qty 5

## 2018-09-16 MED ORDER — LACTATED RINGERS IV SOLN
INTRAVENOUS | Status: DC | PRN
  Administered 2018-09-16: 08:00:00 via INTRAVENOUS

## 2018-09-16 MED ORDER — ROCURONIUM BROMIDE 50 MG/5ML IV SOSY
PREFILLED_SYRINGE | INTRAVENOUS | Status: AC
  Filled 2018-09-16: qty 5

## 2018-09-16 MED ORDER — POTASSIUM CHLORIDE IN NACL 20-0.9 MEQ/L-% IV SOLN
INTRAVENOUS | Status: DC
  Administered 2018-09-16: 10:00:00 via INTRAVENOUS
  Filled 2018-09-16: qty 1000

## 2018-09-16 MED ORDER — SODIUM CHLORIDE 0.9 % IR SOLN
Status: DC | PRN
  Administered 2018-09-16: 1000 mL

## 2018-09-16 MED ORDER — FENTANYL CITRATE (PF) 250 MCG/5ML IJ SOLN
INTRAMUSCULAR | Status: DC | PRN
  Administered 2018-09-16: 150 ug via INTRAVENOUS
  Administered 2018-09-16: 50 ug via INTRAVENOUS

## 2018-09-16 SURGICAL SUPPLY — 62 items
ADH SKN CLS APL DERMABOND .7 (GAUZE/BANDAGES/DRESSINGS) ×1
APPLIER CLIP 5 13 M/L LIGAMAX5 (MISCELLANEOUS)
APPLIER CLIP ROT 10 11.4 M/L (STAPLE)
APR CLP MED LRG 11.4X10 (STAPLE)
APR CLP MED LRG 5 ANG JAW (MISCELLANEOUS)
BAG SPEC RTRVL LRG 6X4 10 (ENDOMECHANICALS) ×1
BLADE CLIPPER SURG (BLADE) IMPLANT
CANISTER SUCT 3000ML PPV (MISCELLANEOUS) ×2 IMPLANT
CHLORAPREP W/TINT 26ML (MISCELLANEOUS) ×2 IMPLANT
CLIP APPLIE 5 13 M/L LIGAMAX5 (MISCELLANEOUS) IMPLANT
CLIP APPLIE ROT 10 11.4 M/L (STAPLE) IMPLANT
COVER SURGICAL LIGHT HANDLE (MISCELLANEOUS) ×2 IMPLANT
COVER WAND RF STERILE (DRAPES) ×2 IMPLANT
CUTTER FLEX LINEAR 45M (STAPLE) IMPLANT
DECANTER SPIKE VIAL GLASS SM (MISCELLANEOUS) ×2 IMPLANT
DERMABOND ADVANCED (GAUZE/BANDAGES/DRESSINGS) ×1
DERMABOND ADVANCED .7 DNX12 (GAUZE/BANDAGES/DRESSINGS) ×1 IMPLANT
DRAPE LAPAROTOMY 100X72 PEDS (DRAPES) ×2 IMPLANT
ELECT REM PT RETURN 9FT ADLT (ELECTROSURGICAL) ×2
ELECTRODE REM PT RTRN 9FT ADLT (ELECTROSURGICAL) ×1 IMPLANT
GLOVE BIO SURGEON STRL SZ 6.5 (GLOVE) ×1 IMPLANT
GLOVE BIOGEL PI IND STRL 6.5 (GLOVE) IMPLANT
GLOVE BIOGEL PI IND STRL 7.0 (GLOVE) IMPLANT
GLOVE BIOGEL PI INDICATOR 6.5 (GLOVE) ×1
GLOVE BIOGEL PI INDICATOR 7.0 (GLOVE) ×1
GLOVE ECLIPSE 6.0 STRL STRAW (GLOVE) ×2 IMPLANT
GLOVE INDICATOR 7.0 STRL GRN (GLOVE) ×2 IMPLANT
GLOVE SURG SIGNA 7.5 PF LTX (GLOVE) ×2 IMPLANT
GLOVE SURG SS PI 7.0 STRL IVOR (GLOVE) ×4 IMPLANT
GOWN STRL REUS W/ TWL LRG LVL3 (GOWN DISPOSABLE) ×5 IMPLANT
GOWN STRL REUS W/ TWL XL LVL3 (GOWN DISPOSABLE) ×1 IMPLANT
GOWN STRL REUS W/TWL LRG LVL3 (GOWN DISPOSABLE) ×10
GOWN STRL REUS W/TWL XL LVL3 (GOWN DISPOSABLE) ×2
KIT BASIN OR (CUSTOM PROCEDURE TRAY) ×2 IMPLANT
KIT TURNOVER KIT B (KITS) ×2 IMPLANT
NEEDLE HYPO 25GX1X1/2 BEV (NEEDLE) ×2 IMPLANT
NS IRRIG 1000ML POUR BTL (IV SOLUTION) ×2 IMPLANT
PACK GENERAL/GYN (CUSTOM PROCEDURE TRAY) ×2 IMPLANT
PAD ARMBOARD 7.5X6 YLW CONV (MISCELLANEOUS) ×4 IMPLANT
PENCIL SMOKE EVACUATOR (MISCELLANEOUS) ×2 IMPLANT
POUCH SPECIMEN RETRIEVAL 10MM (ENDOMECHANICALS) ×2 IMPLANT
RELOAD 45 VASCULAR/THIN (ENDOMECHANICALS) IMPLANT
RELOAD STAPLE 45 2.5 WHT GRN (ENDOMECHANICALS) IMPLANT
RELOAD STAPLE 45 3.5 BLU ETS (ENDOMECHANICALS) IMPLANT
RELOAD STAPLE TA45 3.5 REG BLU (ENDOMECHANICALS) ×2 IMPLANT
SET IRRIG TUBING LAPAROSCOPIC (IRRIGATION / IRRIGATOR) ×2 IMPLANT
SET TUBE SMOKE EVAC HIGH FLOW (TUBING) ×2 IMPLANT
SHEARS HARMONIC ACE PLUS 36CM (ENDOMECHANICALS) ×2 IMPLANT
SLEEVE ENDOPATH XCEL 5M (ENDOMECHANICALS) ×2 IMPLANT
SPECIMEN JAR SMALL (MISCELLANEOUS) ×2 IMPLANT
SUT MNCRL AB 4-0 PS2 18 (SUTURE) ×2 IMPLANT
SUT MON AB 4-0 PC3 18 (SUTURE) ×2 IMPLANT
SUT NOVA NAB DX-16 0-1 5-0 T12 (SUTURE) ×2 IMPLANT
SUT VIC AB 3-0 SH 27 (SUTURE) ×2
SUT VIC AB 3-0 SH 27X BRD (SUTURE) ×1 IMPLANT
SYR CONTROL 10ML LL (SYRINGE) ×2 IMPLANT
TOWEL OR 17X24 6PK STRL BLUE (TOWEL DISPOSABLE) ×2 IMPLANT
TOWEL OR 17X26 10 PK STRL BLUE (TOWEL DISPOSABLE) ×2 IMPLANT
TRAY LAPAROSCOPIC MC (CUSTOM PROCEDURE TRAY) ×2 IMPLANT
TROCAR XCEL BLUNT TIP 100MML (ENDOMECHANICALS) ×2 IMPLANT
TROCAR XCEL NON-BLD 5MMX100MML (ENDOMECHANICALS) ×2 IMPLANT
WATER STERILE IRR 1000ML POUR (IV SOLUTION) ×2 IMPLANT

## 2018-09-16 NOTE — Anesthesia Preprocedure Evaluation (Signed)
Anesthesia Evaluation  Patient identified by MRN, date of birth, ID band Patient awake    Reviewed: Allergy & Precautions, NPO status , Patient's Chart, lab work & pertinent test results  Airway Mallampati: II  TM Distance: >3 FB Neck ROM: Full    Dental no notable dental hx.    Pulmonary neg pulmonary ROS,    Pulmonary exam normal breath sounds clear to auscultation       Cardiovascular negative cardio ROS Normal cardiovascular exam Rhythm:Regular Rate:Normal     Neuro/Psych negative neurological ROS  negative psych ROS   GI/Hepatic negative GI ROS, Neg liver ROS,   Endo/Other  negative endocrine ROS  Renal/GU negative Renal ROS     Musculoskeletal negative musculoskeletal ROS (+)   Abdominal (+) + obese,   Peds  Hematology negative hematology ROS (+)   Anesthesia Other Findings Appendicitis and umbilical hernia  Reproductive/Obstetrics                             Anesthesia Physical Anesthesia Plan  ASA: II and emergent  Anesthesia Plan: General   Post-op Pain Management:    Induction: Intravenous and Rapid sequence  PONV Risk Score and Plan: 3 and Midazolam, Dexamethasone, Ondansetron and Treatment may vary due to age or medical condition  Airway Management Planned: Oral ETT  Additional Equipment:   Intra-op Plan:   Post-operative Plan: Extubation in OR  Informed Consent: I have reviewed the patients History and Physical, chart, labs and discussed the procedure including the risks, benefits and alternatives for the proposed anesthesia with the patient or authorized representative who has indicated his/her understanding and acceptance.     Dental advisory given  Plan Discussed with: CRNA  Anesthesia Plan Comments:         Anesthesia Quick Evaluation

## 2018-09-16 NOTE — Anesthesia Postprocedure Evaluation (Signed)
Anesthesia Post Note  Patient: Brandon Wiggins  Procedure(s) Performed: APPENDECTOMY LAPAROSCOPIC (N/A Abdomen) HERNIA REPAIR UMBILICAL ADULT (N/A Abdomen)     Patient location during evaluation: PACU Anesthesia Type: General Level of consciousness: awake and alert Pain management: pain level controlled Vital Signs Assessment: post-procedure vital signs reviewed and stable Respiratory status: spontaneous breathing, nonlabored ventilation, respiratory function stable and patient connected to nasal cannula oxygen Cardiovascular status: blood pressure returned to baseline and stable Postop Assessment: no apparent nausea or vomiting Anesthetic complications: no    Last Vitals:  Vitals:   09/16/18 1022 09/16/18 1338  BP: 132/82 (!) 142/96  Pulse: 89 88  Resp: 16 16  Temp: 36.5 C 36.5 C  SpO2: 94% 96%    Last Pain:  Vitals:   09/16/18 1338  TempSrc: Oral  PainSc:                  Ryan P Ellender

## 2018-09-16 NOTE — Discharge Instructions (Signed)
[facility] ______[facility] LAPAROSCOPIC SURGERY: POST OP INSTRUCTIONS   AND  UMBILICAL OR INGUINAL HERNIA REPAIR: POST OP INSTRUCTIONS  Always review your discharge instruction sheet given to you by the facility where your surgery was performed. IF YOU HAVE DISABILITY OR FAMILY LEAVE FORMS, YOU MUST BRING THEM TO THE OFFICE FOR PROCESSING.   DO NOT GIVE THEM TO YOUR DOCTOR.  1. A  prescription for pain medication may be given to you upon discharge.  Take your pain medication as prescribed, if needed.  If narcotic pain medicine is not needed, then you may take acetaminophen (Tylenol) or ibuprofen (Advil) as needed. 2. Take your usually prescribed medications unless otherwise directed. If you need a refill on your pain medication, please contact your pharmacy.  They will contact our office to request authorization. Prescriptions will not be filled after 5 pm or on week-ends. 3. You should follow a light diet the first 24 hours after arrival home, such as soup and crackers, etc.  Be sure to include lots of fluids daily.  Resume your normal diet the day after surgery. 4.Most patients will experience some swelling and bruising around the umbilicus or in the groin and scrotum.  Ice packs and reclining will help.  Swelling and bruising can take several days to resolve.  6. It is common to experience some constipation if taking pain medication after surgery.  Increasing fluid intake and taking a stool softener (such as Colace) will usually help or prevent this problem from occurring.  A mild laxative (Milk of Magnesia or Miralax) should be taken according to package directions if there are no bowel movements after 48 hours. 7. Unless discharge instructions indicate otherwise, you may remove your bandages 24-48 hours after surgery, and you may shower at that time.  You may have steri-strips (small skin tapes) in place directly over the incision.  These strips should be left on the skin for 7-10  days.  If your surgeon used skin glue on the incision, you may shower in 24 hours.  The glue will flake off over the next 2-3 weeks.  Any sutures or staples will be removed at the office during your follow-up visit. 8. ACTIVITIES:  You may resume regular (light) daily activities beginning the next day--such as daily self-care, walking, climbing stairs--gradually increasing activities as tolerated.  You may have sexual intercourse when it is comfortable.  Refrain from any heavy lifting or straining until approved by your doctor.  a.You may drive when you are no longer taking prescription pain medication, you can comfortably wear a seatbelt, and you can safely maneuver your car and apply brakes. b.RETURN TO WORK:   _____________________________________________  9.You should see your doctor in the office for a follow-up appointment approximately 2-3 weeks after your surgery.  Make sure that you call for this appointment within a day or two after you arrive home to insure a convenient appointment time. 10.OTHER INSTRUCTIONS: _________________________    _____________________________________  WHEN TO CALL YOUR DOCTOR: 1. Fever over 101.0 2. Inability to urinate 3. Nausea and/or vomiting 4. Extreme swelling or bruising 5. Continued bleeding from incision. 6. Increased pain, redness, or drainage from the incision  The clinic staff is available to answer your questions during regular business hours.  Please dont hesitate to call and ask to speak to one of the nurses for clinical concerns.  If you have a medical emergency, go to the nearest emergency room or call 911.  A surgeon from [facility] is  always on call at the hospital   [address], [place], [place], [place]  [address] ?  [address] [place], [place], [place]   [address] [phone] ? [phone] ? FAX [phone] Web site: [email or web address]

## 2018-09-16 NOTE — Discharge Summary (Signed)
[facility] Discharge Summary   Patient ID: Brandon Wiggins MRN: [id number] DOB/AGE: 01-06-1981 37 y.o.  Admit date: 09/15/2018 Discharge date: 09/16/2018  Admitting Diagnosis: appendicitis  Discharge Diagnosis Patient Active Problem List   Diagnosis Date Noted  . Acute appendicitis 09/15/2018    Consultants none  Imaging: Ct Abdomen Pelvis W Contrast  Result Date: 09/15/2018 CLINICAL DATA:  Right lower quadrant abdominal pain. EXAM: CT ABDOMEN AND PELVIS WITH CONTRAST TECHNIQUE: Multidetector CT imaging of the abdomen and pelvis was performed using the standard protocol following bolus administration of intravenous contrast. CONTRAST:  OMNIPAQUE IOHEXOL 300 MG/ML  SOLN COMPARISON:  None. FINDINGS: Lower chest: No acute abnormality. Hepatobiliary: No focal liver abnormality is seen. No gallstones, gallbladder wall thickening, or biliary dilatation. Pancreas: Unremarkable. No pancreatic ductal dilatation or surrounding inflammatory changes. Spleen: Normal in size without focal abnormality. Adrenals/Urinary Tract: Adrenal glands are unremarkable. Kidneys are normal, without renal calculi, focal lesion, or hydronephrosis. Bladder is unremarkable. Stomach/Bowel: The stomach appears normal. There is no evidence of bowel obstruction or inflammation. The appendix is enlarged with surrounding inflammation consistent with acute appendicitis. Appendix: Location: Right lower quadrant. Diameter: 16 mm. Appendicolith: No. Mucosal hyper-enhancement: Yes. Extraluminal gas: No. Periappendiceal collection: No. Vascular/Lymphatic: No significant vascular findings are present. No enlarged abdominal or pelvic lymph nodes. Reproductive: Prostate is unremarkable. Other: Moderate size fat containing periumbilical hernia is noted. No ascites is noted. Musculoskeletal: No acute or significant osseous findings. IMPRESSION: Findings consistent with acute appendicitis. No definite evidence of  abscess or perforation is noted. Electronically Signed   By: Lupita Raider, M.D.   On: 09/15/2018 22:02    Procedures Dr. Magnus Ivan (09/16/18) - Laparoscopic Appendectomy, umbilical hernia repair  Hospital Course:  Pt is a 38 yo male who presented to [facility] with abdominal pain.  Workup showed appendicitis.  Patient was admitted and underwent procedure listed above.  Tolerated procedure well and was transferred to the floor.  Diet was advanced as tolerated.  On POD#0, the patient was voiding well, tolerating diet, ambulating well, pain well controlled, vital signs stable, incisions c/d/i and felt stable for discharge home.  Patient will follow up as outlined below and knows to call with questions or concerns.     Patient was discharged in good condition.  The [place] Substance controlled database was reviewed prior to prescribing narcotic pain medication to this patient.  Physical Exam: General:  Alert, NAD, pleasant, cooperative Resp: rate and effort normal  Abd:  Soft, ND, incisions with glue intact are well appearing and without drainage or bleeding  Skin: warm and dry, no rashes noted  Allergies as of 09/16/2018   No Known Allergies     Medication List    TAKE these medications   amoxicillin-clavulanate 875-125 MG tablet Commonly known as:  AUGMENTIN Take 1 tablet by mouth every 12 (twelve) hours for 7 days.   oxyCODONE 5 MG immediate release tablet Commonly known as:  Oxy IR/ROXICODONE Take 1 tablet (5 mg total) by mouth every 6 (six) hours as needed for moderate pain.        Follow-up Information    [facility] Follow up on 09/27/2018.   Specialty:  General Surgery Why:  Your follow up will be a phone call appointment  (due to the Corona virus, to limit exposure.) Puja Gosai will call you at 11:15AM.  Please email a picture if you have an concerns with you wound to : [email or web address]  Contact information: [address] [place] [address] [phone]           Signed: Joyce Copa [facility] 09/16/2018, 2:44 PM Pager: [phone] Consults: [phone] Mon-Fri 7:00 am-4:30 pm Sat-Sun 7:00 am-11:30 am

## 2018-09-16 NOTE — Progress Notes (Signed)
Patient ID: Brandon Wiggins, male   DOB: 09-16-1980, 38 y.o.   MRN: [id number]   Patient with acute appendicitis and an umbilical hernia.  Plan lap appendectomy and hernia repair primarily. I discussed the diagnosis.  I explained the surgery in detail.  I explained the risks which include but are not limited to bleeding, infection, injury to surrounding structures, the need for further procedures, DVT, cardiopulmonary issues, post op recovery, etc.  He agrees to proceed.

## 2018-09-16 NOTE — Transfer of Care (Signed)
Immediate Anesthesia Transfer of Care Note  Patient: Brandon Wiggins  Procedure(s) Performed: APPENDECTOMY LAPAROSCOPIC (N/A Abdomen) HERNIA REPAIR UMBILICAL ADULT (N/A Abdomen)  Patient Location: PACU  Anesthesia Type:General  Level of Consciousness: awake, alert , oriented and patient cooperative  Airway & Oxygen Therapy: Patient Spontanous Breathing and Patient connected to face mask oxygen  Post-op Assessment: Report given to RN and Post -op Vital signs reviewed and stable  Post vital signs: Reviewed and stable  Last Vitals:  Vitals Value Taken Time  BP 132/83 09/16/2018  9:35 AM  Temp    Pulse 87 09/16/2018  9:36 AM  Resp 23 09/16/2018  9:36 AM  SpO2 100 % 09/16/2018  9:36 AM  Vitals shown include unvalidated device data.  Last Pain:  Vitals:   09/16/18 0533  TempSrc: Oral  PainSc:          Complications: No apparent anesthesia complications

## 2018-09-16 NOTE — Anesthesia Procedure Notes (Signed)
Procedure Name: Intubation Date/Time: 09/16/2018 8:38 AM Performed by: Shirlyn Goltz, CRNA Pre-anesthesia Checklist: Patient identified, Emergency Drugs available, Suction available and Patient being monitored Patient Re-evaluated:Patient Re-evaluated prior to induction Oxygen Delivery Method: Circle system utilized Preoxygenation: Pre-oxygenation with 100% oxygen Induction Type: IV induction and Rapid sequence Laryngoscope Size: Mac and 4 Grade View: Grade II Tube type: Oral Tube size: 7.5 mm Number of attempts: 1 Airway Equipment and Method: Stylet Placement Confirmation: ETT inserted through vocal cords under direct vision,  positive ETCO2 and breath sounds checked- equal and bilateral Secured at: 21 cm Tube secured with: Tape Dental Injury: Teeth and Oropharynx as per pre-operative assessment

## 2018-09-16 NOTE — Op Note (Signed)
APPENDECTOMY LAPAROSCOPIC, HERNIA REPAIR UMBILICAL ADULT  Procedure Note  Brandon Wiggins 09/15/2018 - 09/16/2018   Pre-op Diagnosis: appendicitis, umbilical hernia     Post-op Diagnosis: same  Procedure(s): APPENDECTOMY LAPAROSCOPIC HERNIA REPAIR UMBILICAL ADULT  Surgeon(s): Abigail Miyamoto, MD  Anesthesia: General  Staff:  Circulator: Lendon Ka, RN Relief Circulator: Maureen Ralphs, RN Scrub Person: Panchit, Ladell Heads, RN; Small, Wyline Beady, RN  Estimated Blood Loss: Minimal               Specimens: sent to path  Findings: The patient was found to have acute appendicitis.  There was fibrinous exudate around the appendix which was very dilated but not perforated. The umbilical hernia was repaired primarily  Procedure: The patient was brought to the operating room and identified the correct patient.  He was placed upon the operating table and general anesthesia was induced.  His abdomen was then prepped and draped in usual sterile fashion.  I made a vertical incision above the umbilicus over the hernia.  I took this down to the hernia sac with a cautery and then excised the sac in its entirety.  I then placed a 0 Vicryl suture around the fascial opening in pursestring fashion.  The [facility] port was placed at the opening and insufflation of the abdomen was begun.  A 5 mm trocar was placed in the right upper quadrant and another in the left lower quadrant under direct vision.  The appendix was found to be acutely dilated and stuck into the pelvis.  I was able to bluntly dissected free.  There was fibrinous exudate around it but no gross perforation.  I then took down the mesoappendix with the Harmonic scalpel.  I then transected the base of the appendix with laparoscopic GIA.  The appendix was then placed in an Endosac and removed through the incision at the umbilicus.  We then thoroughly irrigated the abdomen with a liter of normal saline.  Hemostasis appeared  to be achieved.  The abdomen was deflated and then all trochars were removed.  I tied the 0 Vicryl suture closing the fascial defect.  I then reinforced it with 2 separate #1 Novafil sutures.  All incisions were anesthetized with Marcaine and closed with 4-0 Monocryl sutures and Dermabond.  The patient tolerated procedure well.  All the counts were correct at the end of the procedure.  The patient was then extubated in the operating room and taken in a stable addition to the recovery room.          Abigail Miyamoto   Date: 09/16/2018  Time: 9:16 AM

## 2018-09-17 ENCOUNTER — Encounter (HOSPITAL_COMMUNITY): Payer: Self-pay | Admitting: Surgery

## 2020-03-27 IMAGING — CT CT ABDOMEN AND PELVIS WITH CONTRAST
2 of 4 series · 16 of 46 positions shown, 18 images · IV contrast (Omni 300)
Comparison: None.

CLINICAL DATA: Right lower quadrant abdominal pain.

EXAM:
CT ABDOMEN AND PELVIS WITH CONTRAST
TECHNIQUE: Multidetector CT imaging of the abdomen and pelvis was performed
using the standard protocol following bolus administration of
intravenous contrast.
CONTRAST:  100mL OMNIPAQUE IOHEXOL 300 MG/ML  SOLN

[Series 3: a/p w/ 5mm · axial · 0.84mm/px · z∈[-486,-1]mm · 13 of 107 slices shown, 15 images]
[im 5/107  soft-tissue]
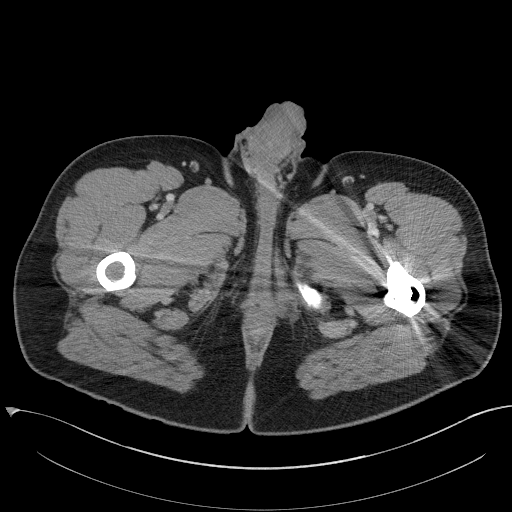
[im 5/107  bone]
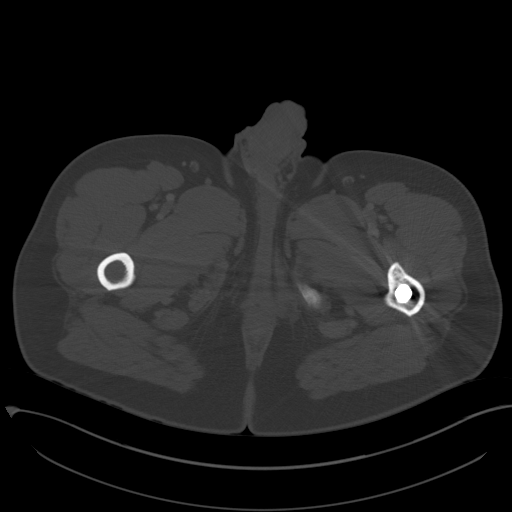
[im 13/107  soft-tissue]
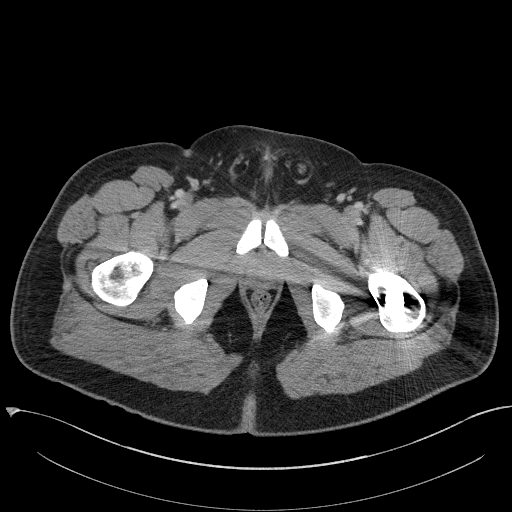
[im 21/107  soft-tissue]
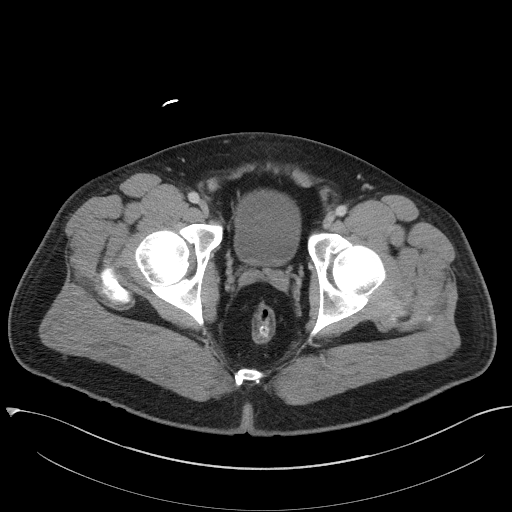
[im 29/107  soft-tissue]
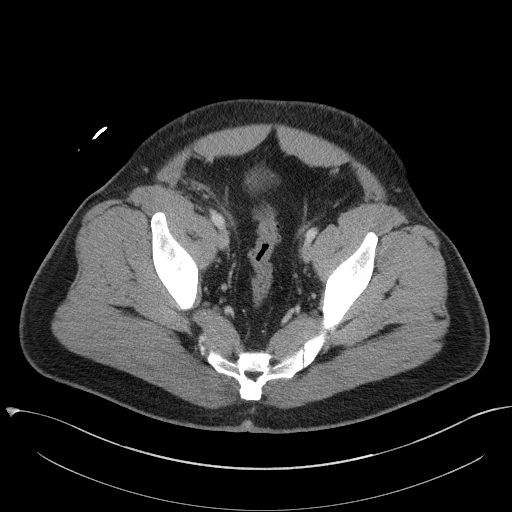
[im 37/107  soft-tissue]
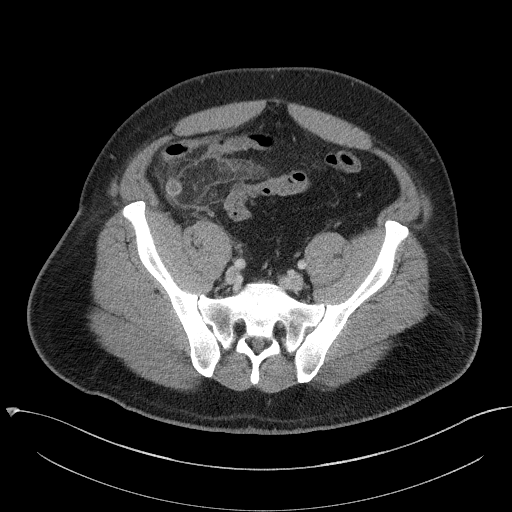
[im 45/107  soft-tissue]
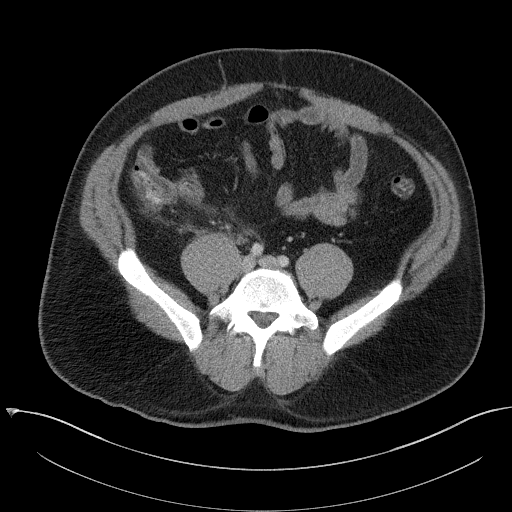
[im 54/107  soft-tissue]
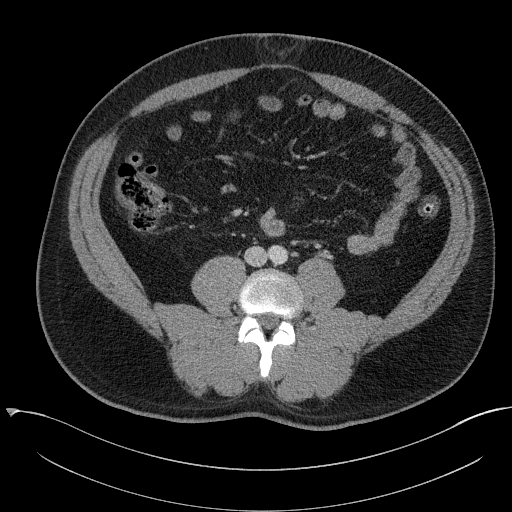
[im 62/107  soft-tissue]
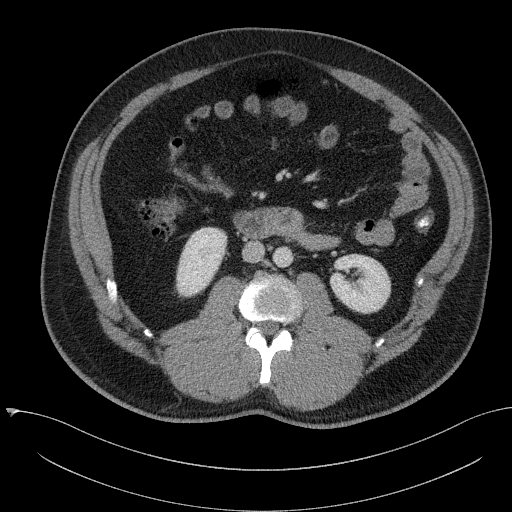
[im 70/107  soft-tissue]
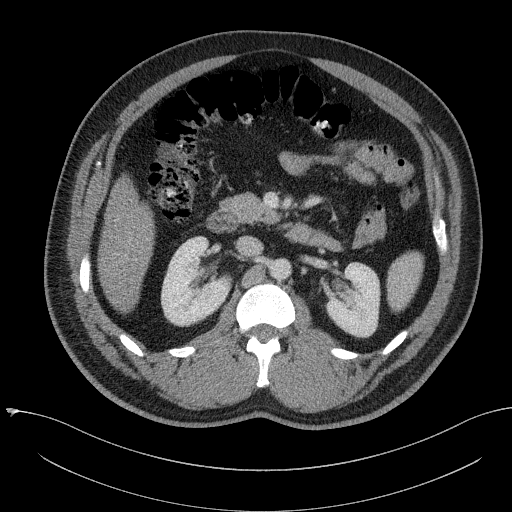
[im 70/107  bone]
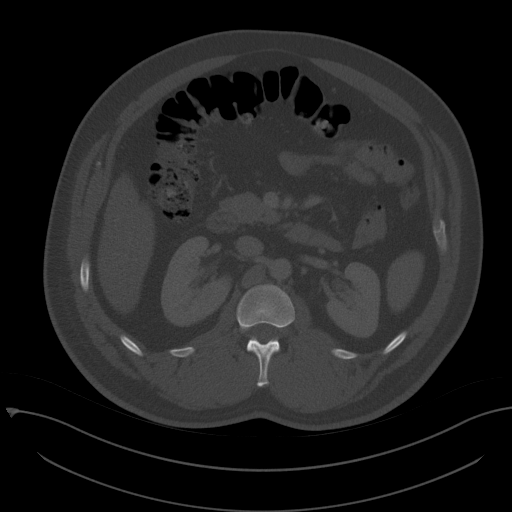
[im 78/107  soft-tissue]
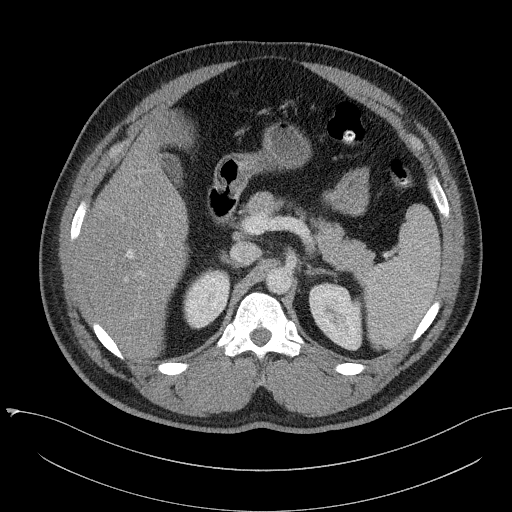
[im 86/107  soft-tissue]
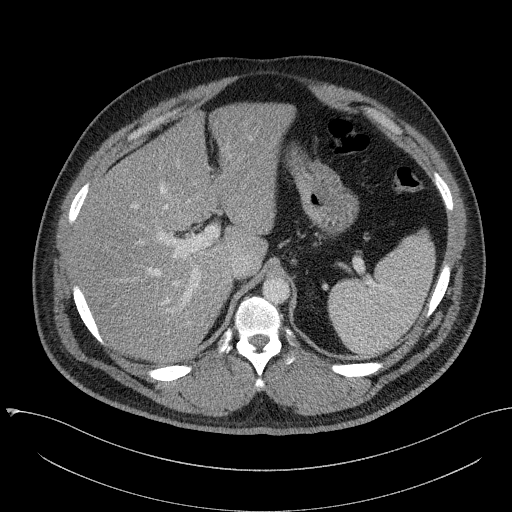
[im 94/107  soft-tissue]
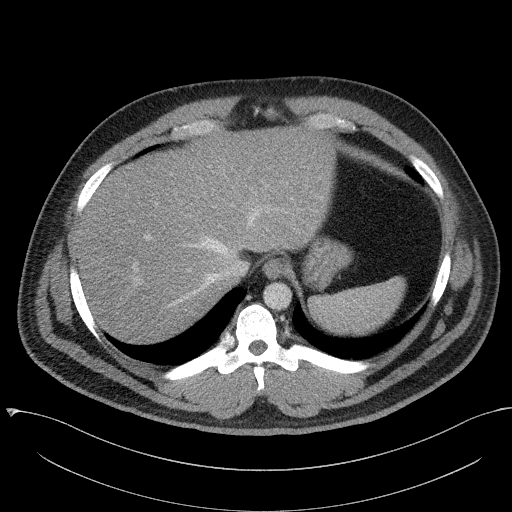
[im 102/107  soft-tissue]
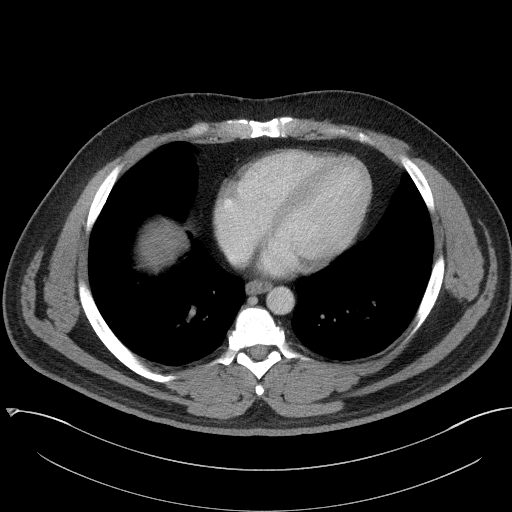

[Series 6: a/p w/ cor · coronal · 0.93mm/px · 3 of 178 slices shown]
[im 60/178  soft-tissue]
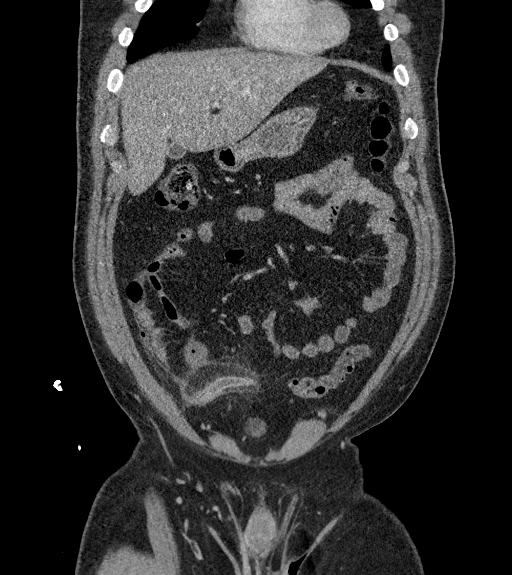
[im 79/178  soft-tissue]
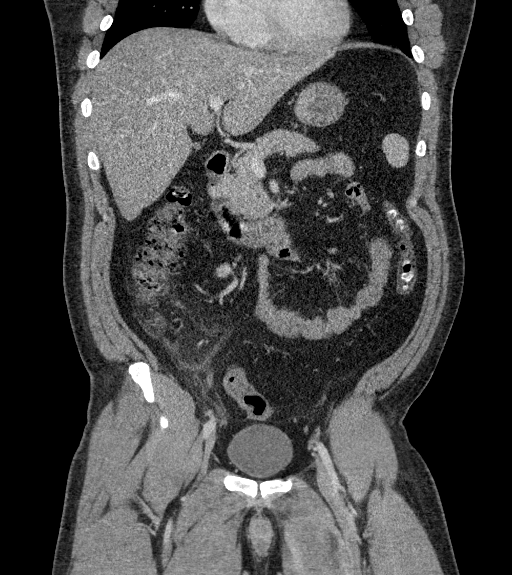
[im 99/178  soft-tissue]
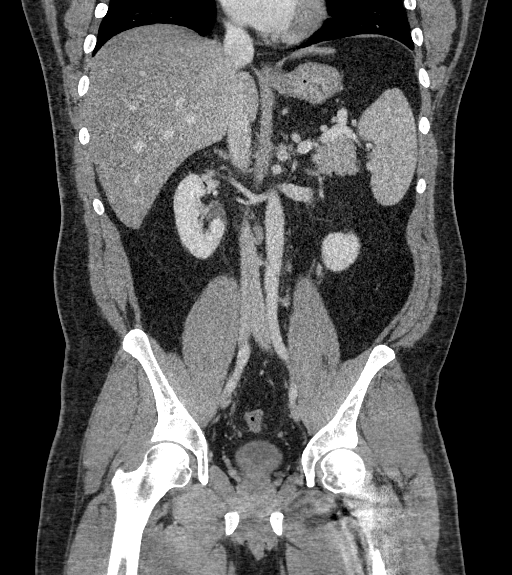

[16 of 46 positions shown; findings below may reference images not displayed]

FINDINGS: Lower chest: No acute abnormality.

Hepatobiliary: No focal liver abnormality is seen. No gallstones,
gallbladder wall thickening, or biliary dilatation.

Pancreas: Unremarkable. No pancreatic ductal dilatation or
surrounding inflammatory changes.

Spleen: Normal in size without focal abnormality.

Adrenals/Urinary Tract: Adrenal glands are unremarkable. Kidneys are
normal, without renal calculi, focal lesion, or hydronephrosis.
Bladder is unremarkable.

Stomach/Bowel: The stomach appears normal. There is no evidence of
bowel obstruction or inflammation. The appendix is enlarged with
surrounding inflammation consistent with acute appendicitis.

Appendix: Location: Right lower quadrant.

Diameter: 16 mm.

Appendicolith: No.

Mucosal hyper-enhancement: Yes.

Extraluminal gas: No.

Periappendiceal collection: No.

Vascular/Lymphatic: No significant vascular findings are present. No
enlarged abdominal or pelvic lymph nodes.

Reproductive: Prostate is unremarkable.

Other: Moderate size fat containing periumbilical hernia is noted.
No ascites is noted.

Musculoskeletal: No acute or significant osseous findings.
IMPRESSION: Findings consistent with acute appendicitis. No definite evidence of
abscess or perforation is noted.

## 2020-04-23 ENCOUNTER — Other Ambulatory Visit: Payer: Self-pay

## 2020-04-23 ENCOUNTER — Emergency Department (HOSPITAL_BASED_OUTPATIENT_CLINIC_OR_DEPARTMENT_OTHER)
Admission: EM | Admit: 2020-04-23 | Discharge: 2020-04-23 | Disposition: A | Discharge status: Home / Self Care | Payer: No Typology Code available for payment source | Attending: Emergency Medicine | Admitting: Emergency Medicine

## 2020-04-23 ENCOUNTER — Encounter (HOSPITAL_BASED_OUTPATIENT_CLINIC_OR_DEPARTMENT_OTHER): Payer: Self-pay | Admitting: *Deleted

## 2020-04-23 ENCOUNTER — Emergency Department (HOSPITAL_BASED_OUTPATIENT_CLINIC_OR_DEPARTMENT_OTHER): Payer: No Typology Code available for payment source

## 2020-04-23 DIAGNOSIS — Y92410 Unspecified street and highway as the place of occurrence of the external cause: Secondary | ICD-10-CM | POA: Insufficient documentation

## 2020-04-23 DIAGNOSIS — S50311A Abrasion of right elbow, initial encounter: Secondary | ICD-10-CM | POA: Insufficient documentation

## 2020-04-23 DIAGNOSIS — S0083XA Contusion of other part of head, initial encounter: Secondary | ICD-10-CM | POA: Insufficient documentation

## 2020-04-23 DIAGNOSIS — Z23 Encounter for immunization: Secondary | ICD-10-CM | POA: Diagnosis not present

## 2020-04-23 DIAGNOSIS — S3992XA Unspecified injury of lower back, initial encounter: Secondary | ICD-10-CM | POA: Diagnosis present

## 2020-04-23 DIAGNOSIS — S239XXA Sprain of unspecified parts of thorax, initial encounter: Secondary | ICD-10-CM | POA: Diagnosis not present

## 2020-04-23 DIAGNOSIS — T07XXXA Unspecified multiple injuries, initial encounter: Secondary | ICD-10-CM

## 2020-04-23 MED ORDER — TETANUS-DIPHTH-ACELL PERTUSSIS 5-2.5-18.5 LF-MCG/0.5 IM SUSY
0.5000 mL | PREFILLED_SYRINGE | Freq: Once | INTRAMUSCULAR | Status: AC
  Administered 2020-04-23: 0.5 mL via INTRAMUSCULAR
  Filled 2020-04-23: qty 0.5
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
## 2020-04-23 NOTE — ED Notes (Signed)
Discharge instructions, wound care, and pain management discussed with patient. Verbalized understanding. Departs ED at this time in stable condition.

## 2020-04-23 NOTE — ED Notes (Signed)
Verbal order for xray
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
## 2020-04-23 NOTE — ED Notes (Signed)
Pt placed in a gown and hooked up to the monitor with a BP cuff and pulse ox 

## 2020-06-25 ENCOUNTER — Ambulatory Visit: Payer: Self-pay

## 2020-06-25 ENCOUNTER — Other Ambulatory Visit: Payer: Self-pay

## 2020-06-25 ENCOUNTER — Ambulatory Visit
Admission: EM | Admit: 2020-06-25 | Discharge: 2020-06-25 | Disposition: A | Discharge status: Home / Self Care | Payer: Self-pay | Attending: Internal Medicine | Admitting: Internal Medicine

## 2020-06-25 ENCOUNTER — Encounter: Payer: Self-pay | Admitting: Emergency Medicine

## 2020-06-25 ENCOUNTER — Ambulatory Visit (INDEPENDENT_AMBULATORY_CARE_PROVIDER_SITE_OTHER): Payer: Self-pay

## 2020-06-25 DIAGNOSIS — S59901A Unspecified injury of right elbow, initial encounter: Secondary | ICD-10-CM

## 2020-06-25 DIAGNOSIS — M25521 Pain in right elbow: Secondary | ICD-10-CM

## 2020-06-25 DIAGNOSIS — G8929 Other chronic pain: Secondary | ICD-10-CM

## 2020-06-25 NOTE — Discharge Instructions (Signed)
Advil as needed for pain Icing of the elbow Gentle range of motion exercises Elbow brace with cushion will be helpful.
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
## 2021-10-24 ENCOUNTER — Ambulatory Visit (INDEPENDENT_AMBULATORY_CARE_PROVIDER_SITE_OTHER): Payer: Self-pay

## 2021-10-24 ENCOUNTER — Ambulatory Visit
Admission: RE | Admit: 2021-10-24 | Discharge: 2021-10-24 | Disposition: A | Discharge status: Home / Self Care | Payer: Self-pay | Source: Ambulatory Visit | Attending: Family Medicine | Admitting: Family Medicine

## 2021-10-24 VITALS — BP 177/102 | HR 80 | Temp 98.6°F | Resp 18

## 2021-10-24 DIAGNOSIS — M79645 Pain in left finger(s): Secondary | ICD-10-CM

## 2021-10-24 MED ORDER — TRAMADOL HCL 50 MG PO TABS: 50.0000 mg | ORAL_TABLET | Freq: Four times a day (QID) | ORAL | 0 refills | Status: DC | PRN

## 2021-10-24 MED ORDER — PREDNISONE 20 MG PO TABS: 40.0000 mg | ORAL_TABLET | Freq: Every day | ORAL | 0 refills | Status: AC

## 2021-10-24 MED ORDER — KETOROLAC TROMETHAMINE 30 MG/ML IJ SOLN
30.0000 mg | Freq: Once | INTRAMUSCULAR | Status: AC
  Administered 2021-10-24: 30 mg via INTRAMUSCULAR

## 2021-10-24 NOTE — ED Provider Notes (Signed)
?[facility] ? ? ? ?CSN: [id number] ?Arrival date & time: 10/24/21  1353 ? ? ?  ? ?History   ?Chief Complaint ?Chief Complaint  ?Patient presents with  ? Finger Injury  ?  Entered by patient  ? ? ?HPI ?Brandon Wiggins is a 41 y.o. male.  ? ?HPI ?Here for left pinky finger pain. It has been bothering him in the last 2-3 weeks, and is worse in the last day or so. Feels swollen in the last day. Hurts to straighten. Hard to flex and close his fist also. No trauma. ? ?No elbow pain. He has had hand numbness intermittently at night for a while. No f/c. NKDA ? ?History reviewed. No pertinent past medical history. ? ?Patient Active Problem List  ? Diagnosis Date Noted  ? Acute appendicitis 09/15/2018  ? ? ?Past Surgical History:  ?Procedure Laterality Date  ? FACIAL RECONSTRUCTION SURGERY    ? FRACTURE SURGERY    ? LAPAROSCOPIC APPENDECTOMY N/A 09/16/2018  ? Procedure: APPENDECTOMY LAPAROSCOPIC;  Surgeon: Coralie Keens, MD;  Location: [place];  Service: General;  Laterality: N/A;  ? NASAL SINUS SURGERY  2009  ? UMBILICAL HERNIA REPAIR N/A 09/16/2018  ? Procedure: HERNIA REPAIR UMBILICAL ADULT;  Surgeon: Coralie Keens, MD;  Location: [place];  Service: General;  Laterality: N/A;  ? ? ? ? ? ?Home Medications   ? ?Prior to Admission medications   ?Medication Sig Start Date End Date Taking? Authorizing Provider  ?predniSONE (DELTASONE) 20 MG tablet Take 2 tablets (40 mg total) by mouth daily with breakfast for 5 days. 10/24/21 10/29/21 Yes Laporsche Hoeger, Gwenlyn Perking, MD  ?traMADol (ULTRAM) 50 MG tablet Take 1 tablet (50 mg total) by mouth every 6 (six) hours as needed. 10/24/21  Yes Barrett Henle, MD  ? ? ?Family History ?History reviewed. No pertinent family history. ? ?Social History ?Social History  ? ?Tobacco Use  ? Smoking status: Never  ? Smokeless tobacco: Never  ?Substance Use Topics  ? Alcohol use: No  ? Drug use: No  ? ? ? ?Allergies   ?Patient has no known allergies. ? ? ?Review of Systems ?Review of  Systems ? ? ?Physical Exam ?Triage Vital Signs ?ED Triage Vitals  ?Enc Vitals Group  ?   BP 10/24/21 1420 (!) 177/102  ?   Pulse Rate 10/24/21 1420 80  ?   Resp 10/24/21 1420 18  ?   Temp 10/24/21 1420 98.6 ?F (37 ?C)  ?   Temp Source 10/24/21 1420 Oral  ?   SpO2 10/24/21 1420 97 %  ?   Weight --   ?   Height --   ?   Head Circumference --   ?   Peak Flow --   ?   Pain Score 10/24/21 1423 6  ?   Pain Loc --   ?   Pain Edu? --   ?   Excl. in [place]? --   ? ?No data found. ? ?Updated Vital Signs ?BP (!) 177/102 (BP Location: Left Arm)   Pulse 80   Temp 98.6 ?F (37 ?C) (Oral)   Resp 18   SpO2 97%  ? ?Visual Acuity ?Right Eye Distance:   ?Left Eye Distance:   ?Bilateral Distance:   ? ?Right Eye Near:   ?Left Eye Near:    ?Bilateral Near:    ? ?Physical Exam ?Vitals reviewed.  ?Constitutional:   ?   General: He is not in acute distress. ?   Appearance: He is  not toxic-appearing.  ?Musculoskeletal:  ?   Comments: Some mild swelling of his left 5th digit on exam. No redness. MCP is tender.   ?Skin: ?   Capillary Refill: Capillary refill takes less than 2 seconds.  ?   Coloration: Skin is not pale.  ?Neurological:  ?   General: No focal deficit present.  ?   Mental Status: He is alert and oriented to person, place, and time.  ?Psychiatric:     ?   Behavior: Behavior normal.  ? ? ? ?UC Treatments / Results  ?Labs ?(all labs ordered are listed, but only abnormal results are displayed) ?Labs Reviewed - No data to display ? ?EKG ? ? ?Radiology ?DG Finger Little Left ? ?Result Date: 10/24/2021 ?CLINICAL DATA:  Left pinky finger pain.  Mainly at the MCP joint. EXAM: LEFT LITTLE FINGER 2+V COMPARISON:  No comparison studies available. FINDINGS: Three-view exam shows no evidence for an acute fracture. No subluxation or dislocation. No worrisome lytic or sclerotic osseous abnormality. IMPRESSION: Negative. Electronically Signed   By: Misty Stanley M.D.   On: 10/24/2021 15:12   ? ?Procedures ?Procedures (including critical care  time) ? ?Medications Ordered in UC ?Medications  ?ketorolac (TORADOL) 30 MG/ML injection 30 mg (has no administration in time range)  ? ? ?Initial Impression / Assessment and Plan / UC Course  ?I have reviewed the triage vital signs and the nursing notes. ? ?Pertinent labs & imaging results that were available during my care of the patient were reviewed by me and considered in my medical decision making (see chart for details). ? ?  ? ?X-ray is read as negative.  We will splint and I will treat him with 5 days of prednisone.  Request made to help him find a primary doctor, and he is given contact information for hand doctor ? ?OK on pmp ?Final Clinical Impressions(s) / UC Diagnoses  ? ?Final diagnoses:  ?Finger pain, left  ? ? ? ?Discharge Instructions   ? ?  ?You have been given a shot of Toradol 30 mg today.  ? ?Prednisone 20 mg --2 daily for 5 days--this is for possible inflammation ? ?Take tramadol 50 mg--- 1 every 6 hours as needed for pain. This is a mild narcotic, and can cause sleepiness ? ? ? ? ?ED Prescriptions   ? ? Medication Sig Dispense Auth. Provider  ? predniSONE (DELTASONE) 20 MG tablet Take 2 tablets (40 mg total) by mouth daily with breakfast for 5 days. 10 tablet Barrett Henle, MD  ? traMADol (ULTRAM) 50 MG tablet Take 1 tablet (50 mg total) by mouth every 6 (six) hours as needed. 10 tablet Barrett Henle, MD  ? ?  ? ?I have reviewed the PDMP during this encounter. ?  ?Barrett Henle, MD ?10/24/21 1527 ? ?

## 2021-10-24 NOTE — ED Triage Notes (Signed)
Pt present left hand/pinky finger injury, pt states that his pinky has a throbbing and burning sensation symptoms started three weeks ago and has not gotten better.  ?

## 2021-10-24 NOTE — Discharge Instructions (Addendum)
You have been given a shot of Toradol 30 mg today.  ? ?Prednisone 20 mg --2 daily for 5 days--this is for possible inflammation ? ?Take tramadol 50 mg--- 1 every 6 hours as needed for pain. This is a mild narcotic, and can cause sleepiness ?

## 2023-11-10 ENCOUNTER — Ambulatory Visit (INDEPENDENT_AMBULATORY_CARE_PROVIDER_SITE_OTHER): Payer: Self-pay | Admitting: Family Medicine

## 2023-11-10 ENCOUNTER — Encounter: Payer: Self-pay | Admitting: Family Medicine

## 2023-11-10 VITALS — BP 160/90 | HR 67 | Temp 97.7°F | Resp 18 | Ht 66.0 in | Wt 265.6 lb

## 2023-11-10 DIAGNOSIS — E66813 Obesity, class 3: Secondary | ICD-10-CM

## 2023-11-10 DIAGNOSIS — Z6841 Body Mass Index (BMI) 40.0 and over, adult: Secondary | ICD-10-CM | POA: Insufficient documentation

## 2023-11-10 DIAGNOSIS — L409 Psoriasis, unspecified: Secondary | ICD-10-CM | POA: Insufficient documentation

## 2023-11-10 DIAGNOSIS — Z1159 Encounter for screening for other viral diseases: Secondary | ICD-10-CM

## 2023-11-10 DIAGNOSIS — I10 Essential (primary) hypertension: Secondary | ICD-10-CM | POA: Insufficient documentation

## 2023-11-10 LAB — BASIC METABOLIC PANEL WITH GFR
BUN: 15 mg/dL (ref 6–23)
CO2: 27 meq/L (ref 19–32)
Calcium: 9.3 mg/dL (ref 8.4–10.5)
Chloride: 105 meq/L (ref 96–112)
Creatinine, Ser: 0.95 mg/dL (ref 0.40–1.50)
GFR: 98.77 mL/min (ref >60.00)
Glucose, Bld: 101 mg/dL — ABNORMAL HIGH (ref 70–99)
Potassium: 4.1 meq/L (ref 3.5–5.1)
Sodium: 140 meq/L (ref 135–145)

## 2023-11-10 MED ORDER — BLOOD PRESSURE KIT: 1.0000 | PACK | 0 refills | Status: AC | PRN

## 2023-11-10 MED ORDER — OLMESARTAN-AMLODIPINE-HCTZ 20-5-12.5 MG PO TABS: 1.0000 | ORAL_TABLET | Freq: Every day | ORAL | 1 refills | Status: DC

## 2023-11-10 MED ORDER — TRIAMCINOLONE ACETONIDE 0.1 % EX CREA: 1.0000 | TOPICAL_CREAM | Freq: Two times a day (BID) | CUTANEOUS | 0 refills | Status: AC

## 2023-11-10 NOTE — Patient Instructions (Signed)
  VISIT SUMMARY: Today, you came in to establish care and discuss several health concerns, including high blood pressure, a persistent rash, and other symptoms. We reviewed your medical history and current symptoms to create a comprehensive care plan.  YOUR PLAN: -ESSENTIAL (PRIMARY) HYPERTENSION: This is a condition where your blood pressure is consistently too high, which can increase your risk of heart disease and other health problems. We will start you on a combination of medications (olmesartan, amlodipine, and hydrochlorothiazide) to help control your blood pressure. You should also monitor your blood pressure at home and we will review your progress in two weeks. A basic metabolic panel will be done to check your kidney function.  -BMI 42: Your BMI indicates obesity, which can contribute to various health issues, including high blood pressure. We will work together to develop a plan to manage your weight.  -PSORIASIS: This is a chronic skin condition that causes red, scaly patches. You have a rash behind your right ear that has not improved with over-the-counter treatments. We will start you on a stronger topical steroid cream (triamcinolone 0.1%) to apply twice daily for 2-4 weeks. If there is no improvement, we may refer you to a dermatologist.  INSTRUCTIONS: Please monitor your blood pressure at home and keep a log of your readings. We will review these at your follow-up appointment in two weeks, where we will also discuss your lab results. If your rash does not improve with the prescribed cream, please let us  know so we can consider a referral to a dermatologist.                          Contains text generated by [employer].

## 2023-11-10 NOTE — Progress Notes (Signed)
 Assessment & Plan   Assessment/Plan:     Assessment & Plan Essential (primary) hypertension Chronic hypertension with consistently elevated readings, including 160/90 mmHg and 170/92 mmHg. No prior treatment initiated. Stressful lifestyle may contribute to elevated blood pressure. Discussed the importance of managing hypertension to reduce cardiovascular risk and starting combination antihypertensive therapy for better control with fewer side effects. - Order basic metabolic panel to assess kidney function. - Initiate combination antihypertensive therapy with olmesartan, amlodipine, and hydrochlorothiazide. - Instruct him to monitor blood pressure at home. - Schedule follow-up appointment in two weeks to evaluate blood pressure control and review lab results.  Obesity,  BMI of 43. Will screen for underlying diabetes, thyroid disease, MASLD, renal dysfunction,  - Recommend healthy life style with diet and exercies  Psoriasis Chronic rash behind the right ear for six months, described as a linear, scaly plaque. Differential diagnosis includes psoriasis, tinea corporis, and lichen simplex chronicus. Previous treatments with over-the-counter antifungals and hydrocortisone were ineffective. Recommended starting a stronger topical steroid cream to manage symptoms. - Prescribe triamcinolone 0.1% cream to apply twice daily for 2-4 weeks. - Consider referral to dermatology if no improvement or worsening of the rash.      Medications Discontinued During This Encounter  Medication Reason   traMADol  (ULTRAM ) 50 MG tablet     Return in about 2 weeks (around 11/24/2023) for BP, physical (fasting labs in 1 week from today around 06/204/2025).        Subjective:   Encounter date: 11/10/2023  Brandon Wiggins is a 43 y.o. male who has Acute appendicitis; Class 3 severe obesity due to excess calories with serious comorbidity and body mass index (BMI) of 40.0 to 44.9 in adult; Primary  hypertension; and Psoriasis on their problem list..   He  has no past medical history on file.Tyee Vandevoorde Aas   He presents with chief complaint of Establish Care (HM due- Hep C screening ), Numbness (Pt c/o of left arm numbness for a couple of month symptoms will come and go ), and Rash (Pt c/o of skin discomfort behind left ear very itchy for 6 month. Pt used OTC antifungal and hydrocortisone cream for symptoms (referral to dermatology request) ) .   Discussed the use of AI scribe software for clinical note transcription with the patient, who gave verbal consent to proceed.  History of Present Illness Brandon Wiggins is a 43 year old male who presents to establish care.  He has a history of hypertension with consistently elevated blood pressure readings during medical visits, including recent measurements of 160/90 mmHg and 170/92 mmHg. Despite having high blood pressure for as long as he can remember, no treatment has been initiated. He is concerned about his blood pressure due to his stressful job and life circumstances. He is overweight and experiences heavy breathing with physical activity. No chest pain, shortness of breath, leg swelling, or abdominal discomfort.  He has a rash behind his right ear for at least six months, described as a linear, worm-like area that sometimes itches but is not painful. The rash extends into his hairline. He has tried various over-the-counter treatments, including antifungal creams, hydrocortisone, and lotions, without improvement.  He experiences intermittent numbness in his left arm over the past few months, though this symptom was not discussed in detail during this visit.  He has experienced headaches throughout his life, which do not significantly impact his daily functioning. They sometimes improve with Tylenol, but not consistently. Reducing soda intake can lead to headaches,  possibly due to caffeine withdrawal.  He reports a decline in his far vision over the past  year, for which he obtained glasses that have improved his distance vision. However, he sometimes experiences blurry vision up close and eye fatigue, which seems to improve with rest.     ROS  Past Surgical History:  Procedure Laterality Date   FACIAL RECONSTRUCTION SURGERY     FRACTURE SURGERY     LAPAROSCOPIC APPENDECTOMY N/A 09/16/2018   Procedure: APPENDECTOMY LAPAROSCOPIC;  Surgeon: Oza Blumenthal, MD;  Location: [facility] OR;  Service: General;  Laterality: N/A;   NASAL SINUS SURGERY  2009   UMBILICAL HERNIA REPAIR N/A 09/16/2018   Procedure: HERNIA REPAIR UMBILICAL ADULT;  Surgeon: Oza Blumenthal, MD;  Location: [facility] OR;  Service: General;  Laterality: N/A;    Outpatient Medications Prior to Visit  Medication Sig Dispense Refill   traMADol  (ULTRAM ) 50 MG tablet Take 1 tablet (50 mg total) by mouth every 6 (six) hours as needed. (Patient not taking: Reported on 11/10/2023) 10 tablet 0   No facility-administered medications prior to visit.    History reviewed. No pertinent family history.  Social History   Socioeconomic History   Marital status: Married    Spouse name: Not on file   Number of children: Not on file   Years of education: Not on file   Highest education level: Not on file  Occupational History   Not on file  Tobacco Use   Smoking status: Never   Smokeless tobacco: Never  Substance and Sexual Activity   Alcohol use: No   Drug use: No   Sexual activity: Yes  Other Topics Concern   Not on file  Social History Narrative   Not on file   Social Drivers of Health   Financial Resource Strain: Not on file  Food Insecurity: Not on file  Transportation Needs: Not on file  Physical Activity: Not on file  Stress: Not on file  Social Connections: Not on file  Intimate Partner Violence: Not on file                                                                                                  Objective:  Physical Exam: BP (!) 160/90 (BP Location: Right  Arm, Patient Position: Sitting, Cuff Size: Large) Comment: recheck done manual  Pulse 67   Temp 97.7 F (36.5 C) (Temporal)   Resp 18   Ht 5\' 6"  (1.676 m)   Wt 265 lb 9.6 oz (120.5 kg)   SpO2 99%   BMI 42.87 kg/m    Physical Exam VITALS: BP- 160/90 MEASUREMENTS: BMI- 43.0. GENERAL: Alert, cooperative, well developed, no acute distress. HEENT: Normocephalic, normal oropharynx, moist mucous membranes. CHEST: Clear to auscultation bilaterally, no wheezes, rhonchi, or crackles. CARDIOVASCULAR: Normal heart rate and rhythm, S1 and S2 normal without murmurs. ABDOMEN: Soft, non-tender, non-distended, without organomegaly, normal bowel sounds. EXTREMITIES: No cyanosis or edema. NEUROLOGICAL: Cranial nerves grossly intact, moves all extremities without gross motor or sensory deficit. SKIN: Linear scaly plaque behind right ear, extends into hairline.   Physical Exam  No results  found.  No results found for this or any previous visit (from the past 2160 hours).      Carnell Christian, MD, MS

## 2023-12-16 ENCOUNTER — Ambulatory Visit (INDEPENDENT_AMBULATORY_CARE_PROVIDER_SITE_OTHER): Payer: Self-pay | Admitting: Family Medicine

## 2023-12-16 ENCOUNTER — Encounter: Payer: Self-pay | Admitting: Family Medicine

## 2023-12-16 VITALS — BP 140/80 | HR 87 | Temp 98.1°F | Ht 66.0 in | Wt 268.2 lb

## 2023-12-16 DIAGNOSIS — G629 Polyneuropathy, unspecified: Secondary | ICD-10-CM

## 2023-12-16 DIAGNOSIS — E66813 Obesity, class 3: Secondary | ICD-10-CM

## 2023-12-16 DIAGNOSIS — Z0000 Encounter for general adult medical examination without abnormal findings: Secondary | ICD-10-CM

## 2023-12-16 DIAGNOSIS — Z13220 Encounter for screening for lipoid disorders: Secondary | ICD-10-CM

## 2023-12-16 DIAGNOSIS — Z1159 Encounter for screening for other viral diseases: Secondary | ICD-10-CM

## 2023-12-16 DIAGNOSIS — Z6841 Body Mass Index (BMI) 40.0 and over, adult: Secondary | ICD-10-CM

## 2023-12-16 DIAGNOSIS — I10 Essential (primary) hypertension: Secondary | ICD-10-CM

## 2023-12-16 LAB — COMPREHENSIVE METABOLIC PANEL WITH GFR
ALT: 35 U/L (ref 0–53)
AST: 22 U/L (ref 0–37)
Albumin: 4.5 g/dL (ref 3.5–5.2)
Alkaline Phosphatase: 53 U/L (ref 39–117)
BUN: 15 mg/dL (ref 6–23)
CO2: 29 meq/L (ref 19–32)
Calcium: 9.1 mg/dL (ref 8.4–10.5)
Chloride: 105 meq/L (ref 96–112)
Creatinine, Ser: 1.04 mg/dL (ref 0.40–1.50)
GFR: 88.54 mL/min (ref >60.00)
Glucose, Bld: 126 mg/dL — ABNORMAL HIGH (ref 70–99)
Potassium: 3.9 meq/L (ref 3.5–5.1)
Sodium: 141 meq/L (ref 135–145)
Total Bilirubin: 0.6 mg/dL (ref 0.2–1.2)
Total Protein: 7.2 g/dL (ref 6.0–8.3)

## 2023-12-16 LAB — HEMOGLOBIN A1C: Hgb A1c MFr Bld: 5.8 % (ref 4.6–6.5)

## 2023-12-16 LAB — CBC WITH DIFFERENTIAL/PLATELET
Basophils Absolute: 0 10*3/uL (ref 0.0–0.1)
Basophils Relative: 0.5 % (ref 0.0–3.0)
Eosinophils Absolute: 0.2 10*3/uL (ref 0.0–0.7)
Eosinophils Relative: 2.8 % (ref 0.0–5.0)
HCT: 39.3 % (ref 39.0–52.0)
Hemoglobin: 13.4 g/dL (ref 13.0–17.0)
Lymphocytes Relative: 25 % (ref 12.0–46.0)
Lymphs Abs: 1.6 10*3/uL (ref 0.7–4.0)
MCHC: 34.2 g/dL (ref 30.0–36.0)
MCV: 84.1 fl (ref 78.0–100.0)
Monocytes Absolute: 0.5 10*3/uL (ref 0.1–1.0)
Monocytes Relative: 8 % (ref 3.0–12.0)
Neutro Abs: 4.1 10*3/uL (ref 1.4–7.7)
Neutrophils Relative %: 63.7 % (ref 43.0–77.0)
Platelets: 254 10*3/uL (ref 150.0–400.0)
RBC: 4.67 Mil/uL (ref 4.22–5.81)
RDW: 13.4 % (ref 11.5–15.5)
WBC: 6.4 10*3/uL (ref 4.0–10.5)

## 2023-12-16 LAB — MICROALBUMIN / CREATININE URINE RATIO
Creatinine,U: 163.6 mg/dL
Microalb Creat Ratio: 4.4 mg/g (ref 0.0–30.0)
Microalb, Ur: 0.7 mg/dL (ref 0.0–1.9)

## 2023-12-16 LAB — LIPID PANEL
Cholesterol: 131 mg/dL (ref 0–200)
HDL: 32.4 mg/dL — ABNORMAL LOW (ref >39.00)
LDL Cholesterol: 51 mg/dL (ref 0–99)
NonHDL: 98.91
Total CHOL/HDL Ratio: 4
Triglycerides: 242 mg/dL — ABNORMAL HIGH (ref 0.0–149.0)
VLDL: 48.4 mg/dL — ABNORMAL HIGH (ref 0.0–40.0)

## 2023-12-16 LAB — TSH: TSH: 1.47 u[IU]/mL (ref 0.35–5.50)

## 2023-12-16 MED ORDER — OLMESARTAN-AMLODIPINE-HCTZ 40-10-25 MG PO TABS: 1.0000 | ORAL_TABLET | Freq: Every day | ORAL | 3 refills | Status: AC

## 2023-12-16 NOTE — Progress Notes (Signed)
 Assessment  Assessment/Plan:  Assessment and Plan Assessment & Plan Left arm numbness Chronic left arm numbness and weakness for over a year, likely due to transient positional nerve compression. Differential includes ulnar tunnel syndrome, cubital tunnel syndrome, carpal tunnel syndrome, or cervical nerve compression. Symptoms are exacerbated by certain positions. - Refer to orthopedics for further evaluation and possible imaging to assess for nerve compression. - Consider nerve conduction studies to evaluate for nerve entrapment or compression.  Hypertension Hypertension not yet optimally controlled but improved from previous readings. Current office blood pressure is 140/80 mmHg, improved from 168/100 mmHg. Home readings vary from 135-187/70-102 mmHg. He reports adherence to medication but ran out 3-4 days ago. Mild constipation reported, manageable with increased water intake. - Increase olmesartan /amlodipine /hydrochlorothiazide to 40/10/25 mg to improve blood pressure control. - Order routine lab work to monitor kidney function, thyroid, liver, and screen for diabetes. - Follow up in one month for blood pressure check.  Severe class III obesity BMI of 43, increasing risk for diabetes and other health issues. Reports frequent urination without dysuria or polydipsia. Routine lab work will assess for diabetes and other potential issues. - Recommend healthy diet and lifestyle modifications to address obesity. - Screen for hepatitis C.     Medications Discontinued During This Encounter  Medication Reason   Olmesartan -amLODIPine -HCTZ 20-5-12.5 MG TABS     Patient Counseling(The following topics were reviewed and/or handout was given):  -Nutrition: Stressed importance of moderation in sodium/caffeine intake, saturated fat and cholesterol, caloric balance, sufficient intake of fresh fruits, vegetables, and fiber.  -Stressed the importance of regular exercise.   -Substance Abuse:  Discussed cessation/primary prevention of tobacco, alcohol, or other drug use; driving or other dangerous activities under the influence; availability of treatment for abuse.   -Injury prevention: Discussed safety belts, safety helmets, smoke detector, smoking near bedding or upholstery.   -Sexuality: Discussed sexually transmitted diseases, partner selection, use of condoms, avoidance of unintended pregnancy and contraceptive alternatives.   -Dental health: Discussed importance of regular tooth brushing, flossing, and dental visits.  -Health maintenance and immunizations reviewed. Please refer to Health maintenance section.  Return in about 1 month (around 01/16/2024) for BP.        Subjective:   Encounter date: 12/16/2023  Chief Complaint  Patient presents with   Hypertension    Follow up; doing about the same; no changes   Weight Loss    Want to discuss help with weight loss    Discussed the use of AI scribe software for clinical note transcription with the patient, who gave verbal consent to proceed.  History of Present Illness Brandon Wiggins is a 43 year old male with hypertension who presents for a follow-up on blood pressure management.  His blood pressure in the office today is 140/80 mmHg, improved from a previous reading of 168/100 mmHg. At home, his blood pressure readings have varied from 135/70 to 187/102 mmHg. He has been taking olmesartan , amlodipine , and hydrochlorothiazide but ran out of medication three to four days ago. No significant side effects from the medication, except for occasional constipation.  He experiences numbness in his left arm, ongoing for over a year, occurring when he sleeps on his side with his arm under a pillow and resolves when he changes position. He notes a lack of strength in his left hand when the numbness occurs. This numbness also occurs in certain positions while awake. No chest pain is reported.  He mentions frequent urination, stating  he can go  up to eight hours without urinating, but if he drinks a bottle of water, he needs to urinate immediately and may go two to three times in quick succession. No burning with urination or increased thirst.       11/10/2023   11:05 AM  Depression screen PHQ 2/9  Decreased Interest 2  Down, Depressed, Hopeless 1  PHQ - 2 Score 3  Altered sleeping 3  Tired, decreased energy 2  Change in appetite 2  Feeling bad or failure about yourself  0  Trouble concentrating 2  Moving slowly or fidgety/restless 0  Suicidal thoughts 0  PHQ-9 Score 12  Difficult doing work/chores Not difficult at all       11/10/2023   11:06 AM  GAD 7 : Generalized Anxiety Score  Nervous, Anxious, on Edge 0  Control/stop worrying 0  Worry too much - different things 0  Trouble relaxing 2  Restless 3  Easily annoyed or irritable 0  Afraid - awful might happen 0  Total GAD 7 Score 5  Anxiety Difficulty Not difficult at all    Health Maintenance Due  Topic Date Due   Hepatitis C Screening  Never done   Hepatitis B Vaccines (1 of 3 - 19+ 3-dose series) Never done   HPV VACCINES (1 - 3-dose SCDM series) Never done   COVID-19 Vaccine (1 - 2024-25 season) Never done       PMH:  The following were reviewed and entered/updated in epic: History reviewed. No pertinent past medical history.  Patient Active Problem List   Diagnosis Date Noted   Neuropathy 12/16/2023   Class 3 severe obesity due to excess calories with serious comorbidity and body mass index (BMI) of 40.0 to 44.9 in adult 11/10/2023   Primary hypertension 11/10/2023   Psoriasis 11/10/2023   Acute appendicitis 09/15/2018    Past Surgical History:  Procedure Laterality Date   FACIAL RECONSTRUCTION SURGERY     FRACTURE SURGERY     LAPAROSCOPIC APPENDECTOMY N/A 09/16/2018   Procedure: APPENDECTOMY LAPAROSCOPIC;  Surgeon: Vernetta Berg, MD;  Location: [facility] OR;  Service: General;  Laterality: N/A;   NASAL SINUS SURGERY  2009    UMBILICAL HERNIA REPAIR N/A 09/16/2018   Procedure: HERNIA REPAIR UMBILICAL ADULT;  Surgeon: Vernetta Berg, MD;  Location: [facility] OR;  Service: General;  Laterality: N/A;    History reviewed. No pertinent family history.  Medications- reviewed and updated Outpatient Medications Prior to Visit  Medication Sig Dispense Refill   Blood Pressure KIT 1 each by Does not apply route as needed (use to check blood pressure daily). 1 kit 0   triamcinolone  cream (KENALOG ) 0.1 % Apply 1 Application topically 2 (two) times daily. 30 g 0   Olmesartan -amLODIPine -HCTZ 20-5-12.5 MG TABS Take 1 tablet by mouth daily at 12 noon. 30 tablet 1   No facility-administered medications prior to visit.    No Known Allergies  Social History   Socioeconomic History   Marital status: Married    Spouse name: Not on file   Number of children: Not on file   Years of education: Not on file   Highest education level: Not on file  Occupational History   Not on file  Tobacco Use   Smoking status: Never   Smokeless tobacco: Never  Substance and Sexual Activity   Alcohol use: No   Drug use: No   Sexual activity: Yes  Other Topics Concern   Not on file  Social History Narrative   Not on file  Social Drivers of Corporate investment banker Strain: Not on file  Food Insecurity: Not on file  Transportation Needs: Not on file  Physical Activity: Not on file  Stress: Not on file  Social Connections: Not on file           Objective:  Physical Exam: BP (!) 140/80   Pulse 87   Temp 98.1 F (36.7 C)   Ht 5' 6 (1.676 m)   Wt 268 lb 3.2 oz (121.7 kg)   SpO2 98%   BMI 43.29 kg/m   Body mass index is 43.29 kg/m. Wt Readings from Last 3 Encounters:  12/16/23 268 lb 3.2 oz (121.7 kg)  11/10/23 265 lb 9.6 oz (120.5 kg)  04/23/20 235 lb (106.6 kg)    Physical Exam VITALS: BP- 140/80 MEASUREMENTS: BMI- 43.0. GENERAL: Alert, cooperative, well developed, no acute distress. HEENT: Normocephalic, normal  oropharynx, moist mucous membranes. CHEST: Clear to auscultation bilaterally, no wheezes, rhonchi, or crackles. CARDIOVASCULAR: Normal heart rate and rhythm, S1 and S2 normal without murmurs. ABDOMEN: Soft, non-tender, non-distended, without organomegaly, normal bowel sounds. EXTREMITIES: No cyanosis or edema. MUSCULOSKELETAL: Arm non-tender, hand grip strength intact. NEUROLOGICAL: Cranial nerves grossly intact, moves all extremities without gross motor or sensory deficit.  Physical Exam      Prior labs:   Recent Results (from the past 2160 hours)  Basic Metabolic Panel (BMET)     Status: Abnormal   Collection Time: 11/10/23 11:03 AM  Result Value Ref Range   Sodium 140 135 - 145 mEq/L   Potassium 4.1 3.5 - 5.1 mEq/L   Chloride 105 96 - 112 mEq/L   CO2 27 19 - 32 mEq/L   Glucose, Bld 101 (H) 70 - 99 mg/dL   BUN 15 6 - 23 mg/dL   Creatinine, Ser 9.04 0.40 - 1.50 mg/dL   GFR 01.22 >39.99 mL/min    Comment: Calculated using the CKD-EPI Creatinine Equation (2021)   Calcium 9.3 8.4 - 10.5 mg/dL    No results found for: CHOL No results found for: HDL No results found for: LDLCALC No results found for: TRIG No results found for: CHOLHDL No results found for: LDLDIRECT  Last metabolic panel Lab Results  Component Value Date   GLUCOSE 101 (H) 11/10/2023   NA 140 11/10/2023   K 4.1 11/10/2023   CL 105 11/10/2023   CO2 27 11/10/2023   BUN 15 11/10/2023   CREATININE 0.95 11/10/2023   GFR 98.77 11/10/2023   CALCIUM 9.3 11/10/2023   PROT 7.9 09/15/2018   ALBUMIN 4.2 09/15/2018   BILITOT 0.8 09/15/2018   ALKPHOS 54 09/15/2018   AST 25 09/15/2018   ALT 33 09/15/2018   ANIONGAP 10 09/15/2018    No results found for: HGBA1C  Last CBC Lab Results  Component Value Date   WBC 10.4 09/15/2018   HGB 14.1 09/15/2018   HCT 41.4 09/15/2018   MCV 85.5 09/15/2018   MCH 29.1 09/15/2018   RDW 12.5 09/15/2018   PLT 269 09/15/2018    No results found for:  TSH  No results found for: PSA1, PSA  Last vitamin D No results found for: MARIEN BOLLS, VD25OH  Lab Results  Component Value Date   BILIRUBINUR NEGATIVE 09/15/2018   PROTEINUR NEGATIVE 09/15/2018   LEUKOCYTESUR NEGATIVE 09/15/2018    No results found for: LABMICR, MICROALBUR   At today's visit, we discussed treatment options, associated risk and benefits, and engage in counseling as needed.  Additionally the following were reviewed: Past  medical records, past medical and surgical history, family and social background, as well as relevant laboratory results, imaging findings, and specialty notes, where applicable.  This message was generated using dictation software, and as a result, it may contain unintentional typos or errors.  Nevertheless, extensive effort was made to accurately convey at the pertinent aspects of the patient visit.    There may have been are other unrelated non-urgent complaints, but due to the busy schedule and the amount of time already spent with him, time does not permit to address these issues at today's visit. Another appointment may have or has been requested to review these additional issues.     Arvella Hummer, MD, MS

## 2023-12-16 NOTE — Patient Instructions (Signed)
  VISIT SUMMARY: Today, you came in for a follow-up on your blood pressure management. Your blood pressure has improved since your last visit, but there are still some concerns. We also discussed the numbness in your left arm and your frequent urination.  YOUR PLAN: -LEFT ARM NUMBNESS: The numbness and weakness in your left arm, which you've had for over a year, is likely due to temporary nerve compression caused by certain positions. We will refer you to an orthopedic specialist for further evaluation and possible imaging to check for nerve compression. We may also consider nerve conduction studies to evaluate for nerve entrapment or compression.  -HYPERTENSION: Your blood pressure is not yet fully controlled but has improved. We will increase your medication dosage to 40/10/25 mg of olmesartan /amlodipine /hydrochlorothiazide to help better manage your blood pressure. We will also order routine lab work to monitor your kidney function, thyroid, liver, and screen for diabetes. Please follow up in one month for another blood pressure check.  -SEVERE CLASS III OBESITY: Your BMI is 43, which increases your risk for diabetes and other health issues. We recommend making healthy diet and lifestyle changes to address your weight. We will also screen for hepatitis C and check for diabetes with routine lab work.  INSTRUCTIONS: Please follow up in one month for a blood pressure check. Additionally, you will be referred to an orthopedic specialist for your left arm numbness, and we will conduct routine lab work to monitor your overall health, including kidney function, thyroid, liver, and diabetes screening.

## 2023-12-17 LAB — URINALYSIS W MICROSCOPIC + REFLEX CULTURE
Bacteria, UA: NONE SEEN /HPF
Bilirubin Urine: NEGATIVE
Glucose, UA: NEGATIVE
Hgb urine dipstick: NEGATIVE
Hyaline Cast: NONE SEEN /LPF
Ketones, ur: NEGATIVE
Leukocyte Esterase: NEGATIVE
Nitrites, Initial: NEGATIVE
Protein, ur: NEGATIVE
RBC / HPF: NONE SEEN /HPF (ref 0–2)
Specific Gravity, Urine: 1.02 (ref 1.001–1.035)
Squamous Epithelial / HPF: NONE SEEN /HPF (ref <=5)
WBC, UA: NONE SEEN /HPF (ref 0–5)
pH: <=5 — AB (ref 5.0–8.0)

## 2023-12-17 LAB — HEPATITIS C ANTIBODY: Hepatitis C Ab: NONREACTIVE

## 2023-12-17 LAB — NO CULTURE INDICATED

## 2023-12-21 ENCOUNTER — Ambulatory Visit: Payer: Self-pay | Admitting: Family Medicine

## 2024-01-04 ENCOUNTER — Encounter: Payer: Self-pay | Admitting: Orthopaedic Surgery

## 2024-01-04 ENCOUNTER — Ambulatory Visit (INDEPENDENT_AMBULATORY_CARE_PROVIDER_SITE_OTHER): Payer: Self-pay | Admitting: Orthopaedic Surgery

## 2024-01-04 DIAGNOSIS — G5602 Carpal tunnel syndrome, left upper limb: Secondary | ICD-10-CM

## 2024-01-04 NOTE — Progress Notes (Signed)
 Office Visit Note   Patient: Brandon Wiggins           Date of Birth: 1981/02/03           MRN: [id number] Visit Date: 01/04/2024              Requested by: Sebastian Beverley NOVAK, MD [address] [place],  [place] [address] PCP: Sebastian Beverley NOVAK, MD   Assessment & Plan: Visit Diagnoses:  1. Left carpal tunnel syndrome     Plan: History of Present Illness Brandon Wiggins is a 43 year old male who presents with numbness and dullness in his left arm and hand.  Numbness and a dull sensation are primarily in the left hand, with some involvement of the left arm, particularly around the biceps. The sensation is described as the hand 'going to sleep', especially when holding a phone or resting the arm in certain positions.  Symptoms are most pronounced at night, often occurring when sleeping on his side with a pillow between his arm. Hand strength is affected, and numbness can also occur during the day when the arm is resting on an armrest or in other specific positions.  Flexing or shaking the hand can sometimes alleviate the numbness, although this does not completely resolve the issue. There is no pain associated with the numbness, and no other systemic symptoms are present.  Physical Exam EXTREMITIES: Hand warm, well perfused, good capillary refill.  Positive carpal tunnel compressive signs.  Assessment and Plan Carpal tunnel syndrome Symptoms indicate early carpal tunnel syndrome in the left hand, with nocturnal numbness and weakness. No circulatory issues noted. - Advise wearing a carpal tunnel brace at night for two weeks. - If no improvement after two weeks, order a nerve conduction study.  Follow-Up Instructions: No follow-ups on file.   Orders:  No orders of the defined types were placed in this encounter.  No orders of the defined types were placed in this encounter.     Procedures: No procedures performed   Clinical Data: No additional  findings.   Subjective: Chief Complaint  Patient presents with   Left Wrist - Numbness    HPI  Review of Systems  Constitutional: Negative.   HENT: Negative.    Eyes: Negative.   Respiratory: Negative.    Cardiovascular: Negative.   Gastrointestinal: Negative.   Endocrine: Negative.   Genitourinary: Negative.   Skin: Negative.   Allergic/Immunologic: Negative.   Neurological: Negative.   Hematological: Negative.   Psychiatric/Behavioral: Negative.    All other systems reviewed and are negative.    Objective: Vital Signs: There were no vitals taken for this visit.  Physical Exam Vitals and nursing note reviewed.  Constitutional:      Appearance: He is well-developed.  HENT:     Head: Normocephalic and atraumatic.  Eyes:     Pupils: Pupils are equal, round, and reactive to light.  Pulmonary:     Effort: Pulmonary effort is normal.  Abdominal:     Palpations: Abdomen is soft.  Musculoskeletal:        General: Normal range of motion.     Cervical back: Neck supple.  Skin:    General: Skin is warm.  Neurological:     Mental Status: He is alert and oriented to person, place, and time.  Psychiatric:        Behavior: Behavior normal.        Thought Content: Thought content normal.  Judgment: Judgment normal.     Ortho Exam  Specialty Comments:  No specialty comments available.  Imaging: No results found.   PMFS History: Patient Active Problem List   Diagnosis Date Noted   Neuropathy 12/16/2023   Class 3 severe obesity due to excess calories with serious comorbidity and body mass index (BMI) of 40.0 to 44.9 in adult 11/10/2023   Primary hypertension 11/10/2023   Psoriasis 11/10/2023   Acute appendicitis 09/15/2018   No past medical history on file.  No family history on file.  Past Surgical History:  Procedure Laterality Date   FACIAL RECONSTRUCTION SURGERY     FRACTURE SURGERY     LAPAROSCOPIC APPENDECTOMY N/A 09/16/2018   Procedure:  APPENDECTOMY LAPAROSCOPIC;  Surgeon: Vernetta Berg, MD;  [facility] OR;  Service: General;  Laterality: N/A;   NASAL SINUS SURGERY  2009   UMBILICAL HERNIA REPAIR N/A 09/16/2018   Procedure: HERNIA REPAIR UMBILICAL ADULT;  Surgeon: Vernetta Berg, MD;  Location: [facility] OR;  Service: General;  Laterality: N/A;   Social History   Occupational History   Not on file  Tobacco Use   Smoking status: Never   Smokeless tobacco: Never  Substance and Sexual Activity   Alcohol use: No   Drug use: No   Sexual activity: Yes

## 2024-01-13 ENCOUNTER — Other Ambulatory Visit: Payer: Self-pay | Admitting: Family Medicine

## 2024-01-13 DIAGNOSIS — I10 Essential (primary) hypertension: Secondary | ICD-10-CM

## 2024-04-17 ENCOUNTER — Encounter: Payer: Self-pay | Admitting: Radiology

## 2024-04-25 ENCOUNTER — Ambulatory Visit
Admission: RE | Admit: 2024-04-25 | Discharge: 2024-04-25 | Disposition: A | Discharge status: Home / Self Care | Payer: Self-pay | Source: Ambulatory Visit

## 2024-04-25 VITALS — BP 124/84 | HR 82 | Temp 98.0°F | Resp 20 | Wt 268.3 lb

## 2024-04-25 DIAGNOSIS — H60332 Swimmer's ear, left ear: Secondary | ICD-10-CM

## 2024-04-25 MED ORDER — OFLOXACIN 0.3 % OT SOLN: 5.0000 [drp] | Freq: Two times a day (BID) | OTIC | 0 refills | Status: AC

## 2024-04-25 NOTE — ED Triage Notes (Signed)
 Pt presents c/o L otalgia x 6 days. Pt states,  I keep having sharp pains in my L ear. My hearing sounds muffled. Everything around the ear is tender to touch.

## 2024-04-25 NOTE — ED Provider Notes (Signed)
 [facility]    CSN: [id number] Arrival date & time: 04/25/24  1254      History   Chief Complaint Chief Complaint  Patient presents with   Ear Drainage    Ear ache - Entered by patient   Otalgia    HPI Brandon Wiggins is a 43 y.o. male presenting w L otalgia. H/o hypertension. Pt presents c/o L otalgia x 6 days. Pt states,  I keep having sharp pains in my L ear. My hearing sounds muffled. Everything around the ear is tender to touch.  Denies recent URI.    HPI  History reviewed. No pertinent past medical history.  Patient Active Problem List   Diagnosis Date Noted   Neuropathy 12/16/2023   Class 3 severe obesity due to excess calories with serious comorbidity and body mass index (BMI) of 40.0 to 44.9 in adult [facility]) 11/10/2023   Primary hypertension 11/10/2023   Psoriasis 11/10/2023   Acute appendicitis 09/15/2018    Past Surgical History:  Procedure Laterality Date   FACIAL RECONSTRUCTION SURGERY     FRACTURE SURGERY     LAPAROSCOPIC APPENDECTOMY N/A 09/16/2018   Procedure: APPENDECTOMY LAPAROSCOPIC;  Surgeon: Vernetta Berg, MD;  Location: [facility] OR;  Service: General;  Laterality: N/A;   NASAL SINUS SURGERY  2009   UMBILICAL HERNIA REPAIR N/A 09/16/2018   Procedure: HERNIA REPAIR UMBILICAL ADULT;  Surgeon: Vernetta Berg, MD;  Location: [facility] OR;  Service: General;  Laterality: N/A;       Home Medications    Prior to Admission medications   Medication Sig Start Date End Date Taking? Authorizing Provider  cyclobenzaprine (FLEXERIL) 10 MG tablet Take 10 mg by mouth 3 (three) times daily as needed. 03/07/24  Yes [provider]  meloxicam (MOBIC) 15 MG tablet Take 15 mg by mouth daily. 03/07/24  Yes [provider]  ofloxacin (FLOXIN) 0.3 % OTIC solution Place 5 drops into the left ear 2 (two) times daily for 7 days. 04/25/24 05/02/24 Yes Arlyss Leita BRAVO, PA-C  Blood Pressure KIT 1 each by Does not apply route as needed (use to check  blood pressure daily). 11/10/23   Sebastian Beverley NOVAK, MD  Olmesartan -amLODIPine -HCTZ 40-10-25 MG TABS Take 1 tablet by mouth daily. 12/16/23 12/10/24  Sebastian Beverley NOVAK, MD  triamcinolone  cream (KENALOG ) 0.1 % Apply 1 Application topically 2 (two) times daily. 11/10/23   Sebastian Beverley NOVAK, MD    Family History History reviewed. No pertinent family history.  Social History Social History   Tobacco Use   Smoking status: Never    Passive exposure: Never   Smokeless tobacco: Never  Vaping Use   Vaping status: Never Used  Substance Use Topics   Alcohol use: No   Drug use: No     Allergies   Patient has no known allergies.   Review of Systems Review of Systems  HENT:  Positive for ear pain.      Physical Exam Triage Vital Signs ED Triage Vitals  Encounter Vitals Group     BP 04/25/24 1308 124/84     Girls Systolic BP Percentile --      Girls Diastolic BP Percentile --      Boys Systolic BP Percentile --      Boys Diastolic BP Percentile --      Pulse Rate 04/25/24 1308 82     Resp 04/25/24 1308 20     Temp 04/25/24 1308 98 F (36.7 C)     Temp Source 04/25/24 1308  Oral     SpO2 04/25/24 1308 97 %     Weight 04/25/24 1307 268 lb 4.8 oz (121.7 kg)     Height --      Head Circumference --      Peak Flow --      Pain Score 04/25/24 1306 8     Pain Loc --      Pain Education --      Exclude from Growth Chart --    No data found.  Updated Vital Signs BP 124/84 (BP Location: Right Arm)   Pulse 82   Temp 98 F (36.7 C) (Oral)   Resp 20   Wt 268 lb 4.8 oz (121.7 kg)   SpO2 97%   BMI 43.30 kg/m   Visual Acuity Right Eye Distance:   Left Eye Distance:   Bilateral Distance:    Right Eye Near:   Left Eye Near:    Bilateral Near:     Physical Exam Vitals reviewed.  Constitutional:      General: He is not in acute distress.    Appearance: Normal appearance. He is not ill-appearing.  HENT:     Head: Normocephalic and atraumatic.     Right Ear: Hearing,  tympanic membrane, ear canal and external ear normal.     Left Ear: External ear normal.     Ears:     Comments: The left external auditory canal is swollen, and tender with insertion of otoscope.  Tympanic membrane is partially visible, and no defect is noted. Pulmonary:     Effort: Pulmonary effort is normal.  Neurological:     General: No focal deficit present.     Mental Status: He is alert and oriented to person, place, and time.  Psychiatric:        Mood and Affect: Mood normal.        Behavior: Behavior normal.        Thought Content: Thought content normal.        Judgment: Judgment normal.      UC Treatments / Results  Labs (all labs ordered are listed, but only abnormal results are displayed) Labs Reviewed - No data to display  EKG   Radiology No results found.  Procedures Procedures (including critical care time)  Medications Ordered in UC Medications - No data to display  Initial Impression / Assessment and Plan / UC Course  I have reviewed the triage vital signs and the nursing notes.  Pertinent labs & imaging results that were available during my care of the patient were reviewed by me and considered in my medical decision making (see chart for details).     Patient is a pleasant 43 year old male presenting with left otitis externa. The patient is afebrile and nontachycardic.  Antipyretic has not been administered today. No recent URI.  Will manage with Ofloxacin and clean dry ear precautions.   Final Clinical Impressions(s) / UC Diagnoses   Final diagnoses:  Acute swimmer's ear of left side     Discharge Instructions      -Ofloxacin drops twice daily x7 days -Keep the ear dry. Use an earplug in the shower      ED Prescriptions     Medication Sig Dispense Auth. Provider   ofloxacin (FLOXIN) 0.3 % OTIC solution Place 5 drops into the left ear 2 (two) times daily for 7 days. 5 mL Arlyss Leita BRAVO, PA-C      PDMP not reviewed this  encounter.   Burkley Dech E,  PA-C 04/25/24 1319

## 2024-04-25 NOTE — Discharge Instructions (Addendum)
-  Ofloxacin drops twice daily x7 days -Keep the ear dry. Use an earplug in the shower
